# Patient Record
Sex: Male | Born: 1975 | Race: Black or African American | Hispanic: No | Marital: Single | Smoking: Never smoker
Health system: Southern US, Community
[De-identification: ages and names within clinical notes are randomized; demographics above are authoritative.]

## PROBLEM LIST (undated history)

## (undated) DIAGNOSIS — J189 Pneumonia, unspecified organism: Secondary | ICD-10-CM

## (undated) DIAGNOSIS — K219 Gastro-esophageal reflux disease without esophagitis: Secondary | ICD-10-CM

## (undated) HISTORY — PX: KNEE SURGERY: SHX244

## (undated) HISTORY — PX: BACK SURGERY: SHX140

---

## 2000-02-26 ENCOUNTER — Encounter: Payer: Self-pay | Admitting: Emergency Medicine

## 2000-02-26 ENCOUNTER — Emergency Department (HOSPITAL_COMMUNITY): Admission: EM | Admit: 2000-02-26 | Discharge: 2000-02-26 | Payer: Self-pay | Admitting: Emergency Medicine

## 2003-02-07 ENCOUNTER — Emergency Department (HOSPITAL_COMMUNITY): Admission: EM | Admit: 2003-02-07 | Discharge: 2003-02-07 | Payer: Self-pay | Admitting: Emergency Medicine

## 2003-02-25 ENCOUNTER — Emergency Department (HOSPITAL_COMMUNITY): Admission: EM | Admit: 2003-02-25 | Discharge: 2003-02-25 | Payer: Self-pay | Admitting: Emergency Medicine

## 2003-03-19 ENCOUNTER — Emergency Department (HOSPITAL_COMMUNITY): Admission: EM | Admit: 2003-03-19 | Discharge: 2003-03-19 | Payer: Self-pay | Admitting: Emergency Medicine

## 2009-10-30 ENCOUNTER — Emergency Department (HOSPITAL_COMMUNITY): Admission: EM | Admit: 2009-10-30 | Discharge: 2009-10-30 | Payer: Self-pay | Admitting: Emergency Medicine

## 2009-10-31 ENCOUNTER — Encounter: Admission: RE | Admit: 2009-10-31 | Discharge: 2009-10-31 | Payer: Self-pay | Admitting: Family Medicine

## 2009-11-25 ENCOUNTER — Inpatient Hospital Stay (HOSPITAL_COMMUNITY): Admission: RE | Admit: 2009-11-25 | Discharge: 2009-11-26 | Payer: Self-pay | Admitting: Orthopedic Surgery

## 2009-11-25 ENCOUNTER — Encounter (INDEPENDENT_AMBULATORY_CARE_PROVIDER_SITE_OTHER): Payer: Self-pay | Admitting: Orthopedic Surgery

## 2010-01-22 ENCOUNTER — Encounter: Admission: RE | Admit: 2010-01-22 | Discharge: 2010-01-22 | Payer: Self-pay | Admitting: Orthopedic Surgery

## 2010-07-10 LAB — CBC
MCH: 29.6 pg (ref 26.0–34.0)
MCHC: 33.4 g/dL (ref 30.0–36.0)
Platelets: 205 10*3/uL (ref 150–400)
RBC: 4.05 MIL/uL — ABNORMAL LOW (ref 4.22–5.81)
RDW: 12.8 % (ref 11.5–15.5)

## 2010-07-10 LAB — BASIC METABOLIC PANEL
BUN: 14 mg/dL (ref 6–23)
CO2: 30 mEq/L (ref 19–32)
Calcium: 8.5 mg/dL (ref 8.4–10.5)
Chloride: 107 mEq/L (ref 96–112)
Creatinine, Ser: 1.47 mg/dL (ref 0.4–1.5)
GFR calc Af Amer: >60 mL/min (ref >60)
GFR calc non Af Amer: 55 mL/min — ABNORMAL LOW (ref >60)
Glucose, Bld: 105 mg/dL — ABNORMAL HIGH (ref 70–99)
Potassium: 4.2 mEq/L (ref 3.5–5.1)
Sodium: 140 mEq/L (ref 135–145)

## 2010-07-11 LAB — DIFFERENTIAL
Basophils Absolute: 0.1 10*3/uL (ref 0.0–0.1)
Basophils Relative: 1 % (ref 0–1)
Lymphocytes Relative: 24 % (ref 12–46)
Monocytes Absolute: 0.5 10*3/uL (ref 0.1–1.0)
Monocytes Relative: 8 % (ref 3–12)
Neutro Abs: 4.2 10*3/uL (ref 1.7–7.7)
Neutrophils Relative %: 65 % (ref 43–77)

## 2010-07-11 LAB — URINALYSIS, ROUTINE W REFLEX MICROSCOPIC
Bilirubin Urine: NEGATIVE
Ketones, ur: NEGATIVE mg/dL
Nitrite: NEGATIVE
Protein, ur: NEGATIVE mg/dL
Urobilinogen, UA: 1 mg/dL (ref 0.0–1.0)

## 2010-07-11 LAB — COMPREHENSIVE METABOLIC PANEL
Albumin: 4.3 g/dL (ref 3.5–5.2)
Alkaline Phosphatase: 77 U/L (ref 39–117)
BUN: 12 mg/dL (ref 6–23)
Creatinine, Ser: 1.16 mg/dL (ref 0.4–1.5)
Glucose, Bld: 102 mg/dL — ABNORMAL HIGH (ref 70–99)
Total Bilirubin: 1.2 mg/dL (ref 0.3–1.2)
Total Protein: 7 g/dL (ref 6.0–8.3)

## 2010-07-11 LAB — ABO/RH: ABO/RH(D): O POS

## 2010-07-11 LAB — TYPE AND SCREEN: ABO/RH(D): O POS

## 2010-07-11 LAB — APTT: aPTT: 34 seconds (ref 24–37)

## 2010-07-11 LAB — CBC
HCT: 43.7 % (ref 39.0–52.0)
MCH: 30.5 pg (ref 26.0–34.0)
MCV: 90.8 fL (ref 78.0–100.0)
Platelets: 203 10*3/uL (ref 150–400)
RDW: 13.6 % (ref 11.5–15.5)

## 2010-07-11 LAB — PROTIME-INR: INR: 0.98 (ref 0.00–1.49)

## 2010-11-03 ENCOUNTER — Emergency Department (HOSPITAL_COMMUNITY)
Admission: EM | Admit: 2010-11-03 | Discharge: 2010-11-04 | Disposition: A | Discharge status: Home / Self Care | Payer: Self-pay | Attending: Emergency Medicine | Admitting: Emergency Medicine

## 2010-11-03 DIAGNOSIS — R51 Headache: Secondary | ICD-10-CM | POA: Insufficient documentation

## 2010-11-03 DIAGNOSIS — M542 Cervicalgia: Secondary | ICD-10-CM | POA: Insufficient documentation

## 2010-11-03 DIAGNOSIS — R509 Fever, unspecified: Secondary | ICD-10-CM | POA: Insufficient documentation

## 2010-11-04 ENCOUNTER — Encounter (HOSPITAL_COMMUNITY): Payer: Self-pay

## 2010-11-04 ENCOUNTER — Emergency Department (HOSPITAL_COMMUNITY): Payer: Self-pay

## 2010-11-04 LAB — DIFFERENTIAL
Eosinophils Absolute: 0 10*3/uL (ref 0.0–0.7)
Eosinophils Relative: 0 % (ref 0–5)
Lymphocytes Relative: 11 % — ABNORMAL LOW (ref 12–46)
Lymphs Abs: 0.8 10*3/uL (ref 0.7–4.0)
Monocytes Absolute: 0.5 10*3/uL (ref 0.1–1.0)
Monocytes Relative: 6 % (ref 3–12)

## 2010-11-04 LAB — BASIC METABOLIC PANEL
BUN: 11 mg/dL (ref 6–23)
CO2: 31 mEq/L (ref 19–32)
Calcium: 9.7 mg/dL (ref 8.4–10.5)
Chloride: 103 mEq/L (ref 96–112)
Creatinine, Ser: 1.19 mg/dL (ref 0.50–1.35)
Glucose, Bld: 118 mg/dL — ABNORMAL HIGH (ref 70–99)

## 2010-11-04 LAB — CBC
HCT: 44.4 % (ref 39.0–52.0)
MCH: 29.6 pg (ref 26.0–34.0)
MCHC: 33.6 g/dL (ref 30.0–36.0)
MCV: 88.3 fL (ref 78.0–100.0)
Platelets: 249 10*3/uL (ref 150–400)
RDW: 13.3 % (ref 11.5–15.5)

## 2010-11-04 LAB — APTT: aPTT: 36 seconds (ref 24–37)

## 2011-06-27 ENCOUNTER — Emergency Department (INDEPENDENT_AMBULATORY_CARE_PROVIDER_SITE_OTHER): Payer: Self-pay

## 2011-06-27 ENCOUNTER — Encounter (HOSPITAL_BASED_OUTPATIENT_CLINIC_OR_DEPARTMENT_OTHER): Payer: Self-pay | Admitting: *Deleted

## 2011-06-27 ENCOUNTER — Emergency Department (HOSPITAL_BASED_OUTPATIENT_CLINIC_OR_DEPARTMENT_OTHER)
Admission: EM | Admit: 2011-06-27 | Discharge: 2011-06-27 | Disposition: A | Discharge status: Home / Self Care | Payer: Self-pay | Attending: Emergency Medicine | Admitting: Emergency Medicine

## 2011-06-27 DIAGNOSIS — M545 Low back pain, unspecified: Secondary | ICD-10-CM | POA: Insufficient documentation

## 2011-06-27 DIAGNOSIS — M5137 Other intervertebral disc degeneration, lumbosacral region: Secondary | ICD-10-CM

## 2011-06-27 DIAGNOSIS — M51379 Other intervertebral disc degeneration, lumbosacral region without mention of lumbar back pain or lower extremity pain: Secondary | ICD-10-CM | POA: Insufficient documentation

## 2011-06-27 DIAGNOSIS — M79609 Pain in unspecified limb: Secondary | ICD-10-CM | POA: Insufficient documentation

## 2011-06-27 DIAGNOSIS — J45909 Unspecified asthma, uncomplicated: Secondary | ICD-10-CM | POA: Insufficient documentation

## 2011-06-27 DIAGNOSIS — IMO0002 Reserved for concepts with insufficient information to code with codable children: Secondary | ICD-10-CM

## 2011-06-27 MED ORDER — DIAZEPAM 5 MG PO TABS: 5.0000 mg | ORAL_TABLET | Freq: Two times a day (BID) | ORAL | Status: AC

## 2011-06-27 MED ORDER — OXYCODONE-ACETAMINOPHEN 5-325 MG PO TABS: 2.0000 | ORAL_TABLET | ORAL | Status: AC | PRN

## 2011-06-27 MED ORDER — KETOROLAC TROMETHAMINE 30 MG/ML IJ SOLN
60.0000 mg | Freq: Once | INTRAMUSCULAR | Status: AC
  Administered 2011-06-27: 60 mg via INTRAMUSCULAR
  Filled 2011-06-27: qty 2

## 2011-06-27 MED ORDER — MORPHINE SULFATE 4 MG/ML IJ SOLN
4.0000 mg | Freq: Once | INTRAMUSCULAR | Status: DC
  Filled 2011-06-27: qty 1

## 2011-06-27 MED ORDER — IBUPROFEN 800 MG PO TABS: 800.0000 mg | ORAL_TABLET | Freq: Three times a day (TID) | ORAL | Status: AC

## 2011-06-27 MED ORDER — DIAZEPAM 5 MG/ML IJ SOLN
5.0000 mg | Freq: Once | INTRAMUSCULAR | Status: AC
  Administered 2011-06-27: 5 mg via INTRAMUSCULAR
  Filled 2011-06-27: qty 2

## 2011-06-27 MED ORDER — OXYCODONE-ACETAMINOPHEN 5-325 MG PO TABS
1.0000 | ORAL_TABLET | Freq: Once | ORAL | Status: AC
  Administered 2011-06-27: 1 via ORAL
  Filled 2011-06-27: qty 1

## 2011-06-27 NOTE — Discharge Instructions (Signed)
Degenerative Disc Disease Degenerative disc disease is a condition caused by the changes that occur in the cushions of the backbone (spinal discs) as you grow older. Spinal discs are soft and compressible discs located between the bones of the spine (vertebrae). They act like shock absorbers. Degenerative disc disease can affect the wholespine. However, the neck and lower back are most commonly affected. Many changes can occur in the spinal discs with aging, such as:  The spinal discs may dry and shrink.   Small tears may occur in the tough, outer covering of the disc (annulus).   The disc space may become smaller due to loss of water.   Abnormal growths in the bone (spurs) may occur. This can put pressure on the nerve roots exiting the spinal canal, causing pain.   The spinal canal may become narrowed.  CAUSES  Degenerative disc disease is a condition caused by the changes that occur in the spinal discs with aging. The exact cause is not known, but there is a genetic basis for many patients. Degenerative changes can occur due to loss of fluid in the disc. This makes the disc thinner and reduces the space between the backbones. Small cracks can develop in the outer layer of the disc. This can lead to the breakdown of the disc. You are more likely to get degenerative disc disease if you are overweight. Smoking cigarettes and doing heavy work such as weightlifting can also increase your risk of this condition. Degenerative changes can start after a sudden injury. Growth of bone spurs can compress the nerve roots and cause pain.  SYMPTOMS  The symptoms vary from person to person. Some people may have no pain, while others have severe pain. The pain may be so severe that it can limit your activities. The location of the pain depends on the part of your backbone that is affected. You will have neck or arm pain if a disc in the neck area is affected. You will have pain in your back, buttocks, or legs if a  disc in the lower back is affected. The pain becomes worse while bending, reaching up, or with twisting movements. The pain may start gradually and then get worse as time passes. It may also start after a major or minor injury. You may feel numbness or tingling in the arms or legs.  DIAGNOSIS  Your caregiver will ask you about your symptoms and about activities or habits that may cause the pain. He or she may also ask about any injuries, diseases, ortreatments you have had earlier. Your caregiver will examine you to check for the range of movement that is possible in the affected area, to check for strength in your extremities, and to check for sensation in the areas of the arms and legs supplied by different nerve roots. An X-ray of the spine may be taken. Your caregiver may suggest other imaging tests, such as a computerized magnetic scan (MRI), if needed.  TREATMENT  Treatment includes rest, modifying your activities, and applying ice and heat. Your caregiver may prescribe medicines to reduce your pain and may ask you to do some exercises to strengthen your back. In some cases, you may need surgery. You and your caregiver will decide on the treatment that is best for you. HOME CARE INSTRUCTIONS   Follow proper lifting and walking techniques as advised by your caregiver.   Maintain good posture.   Exercise regularly as advised.   Perform relaxation exercises.   Change your sitting,   standing, and sleeping habits as advised. Change positions frequently.   Lose weight as advised.   Stop smoking if you smoke.   Wear supportive footwear.  SEEK MEDICAL CARE IF:  The pain does not go away within 1 to 4 weeks. SEEK IMMEDIATE MEDICAL CARE IF:   The pain is severe.   You notice weakness in your arms, hands, or legs.   You begin to lose control of your bladder or bowel.  MAKE SURE YOU:   Understand these instructions.   Will watch your condition.   Will get help right away if you are not  doing well or get worse.  Document Released: 02/07/2007 Document Revised: 04/01/2011 Document Reviewed: 02/07/2007 [employer] Patient Information 2012 [employer]. 

## 2011-06-27 NOTE — ED Provider Notes (Signed)
History     CSN: [id number]  Arrival date & time 06/27/11  1912   First MD Initiated Contact with Patient 06/27/11 1930      Chief Complaint  Patient presents with  . Back Pain    (Consider location/radiation/quality/duration/timing/severity/associated sxs/prior treatment) HPI Comments: Pt denies any recent injury:pt states that he has a history of 3 back surgeries in the past:pt states that he started a new workout this week but his symptoms started a couple days after the last workout:pt states that he hasn't been under any treatment since his last surgery  Patient is a 36 y.o. male presenting with back pain. The history is provided by the patient. No language interpreter was used.  Back Pain  This is a recurrent problem. The current episode started 6 to 12 hours ago. The problem occurs constantly. The problem has not changed since onset.The pain is associated with no known injury. The pain is present in the lumbar spine. The quality of the pain is described as shooting. The pain radiates to the right thigh. The pain is moderate. The symptoms are aggravated by bending, twisting and certain positions. The pain is the same all the time. Pertinent negatives include no fever, no bowel incontinence, no perianal numbness, no dysuria and no weakness. He has tried nothing for the symptoms.    Past Medical History  Diagnosis Date  . Asthma     Past Surgical History  Procedure Date  . Back surgery     History reviewed. No pertinent family history.  History  Substance Use Topics  . Smoking status: Never Smoker   . Smokeless tobacco: Not on file  . Alcohol Use:       Review of Systems  Constitutional: Negative for fever.  Gastrointestinal: Negative for bowel incontinence.  Genitourinary: Negative for dysuria.  Musculoskeletal: Positive for back pain.  Neurological: Negative for weakness.  All other systems reviewed and are negative.    Allergies  Shellfish allergy and  Morphine and related  Home Medications   Current Outpatient Rx  Name Route Sig Dispense Refill  . IBUPROFEN 200 MG PO TABS Oral Take 600 mg by mouth every 6 (six) hours as needed. For pain      BP 105/66  Pulse 74  Temp(Src) 97.2 F (36.2 C) (Oral)  Resp 17  Ht 5\' 8"  (1.727 m)  Wt 178 lb (80.74 kg)  BMI 27.06 kg/m2  SpO2 99%  Physical Exam  Nursing note and vitals reviewed. Constitutional: He is oriented to person, place, and time. He appears well-developed and well-nourished.  HENT:  Head: Normocephalic and atraumatic.  Cardiovascular: Normal rate and regular rhythm.   Pulmonary/Chest: Effort normal and breath sounds normal.  Musculoskeletal: Normal range of motion.       Lumbar back: He exhibits tenderness and pain. He exhibits normal range of motion.  Neurological: He is alert and oriented to person, place, and time. He exhibits normal muscle tone. Coordination normal.  Skin: Skin is warm and dry.  Psychiatric: He has a normal mood and affect.    ED Course  Procedures (including critical care time)  Labs Reviewed - No data to display Dg Lumbar Spine Complete  06/27/2011  *RADIOLOGY REPORT*  Clinical Data: Low back pain, prior back surgeries  LUMBAR SPINE - COMPLETE 4+ VIEW  Comparison: 11/25/2009 Correlation:  MRI lumbar spine 01/22/2010  Findings: Five non-rib bearing lumbar vertebrae. Osseous mineralization normal. Disc space narrowing L4-L5. Vertebral body and disc space heights otherwise maintained. No acute  fracture, subluxation or bone destruction. No spondylolysis. SI joints symmetric.  IMPRESSION: Mild degenerative disc disease changes L4-L5. No acute bony abnormalities.  Original Report Authenticated By: Lollie Marrow, M.D.     1. DDD (degenerative disc disease)       MDM  Pt is not having any neuro deficit:pt is able to ambulate without any problems        Teressa Lower, NP 06/27/11 2200

## 2011-06-27 NOTE — ED Notes (Signed)
Back pain starting tonight, states that he is unable to sit up and the pain is only relieved when his right leg is bent. Pain is in lower back and radiates down the right leg. States he has had back surgery, last sx in 2011

## 2011-06-30 NOTE — ED Provider Notes (Signed)
Evaluation and management procedures were performed by the PA/NP under my supervision/collaboration.   Felisa Bonier, MD 06/30/11 2035

## 2011-10-12 ENCOUNTER — Encounter (HOSPITAL_BASED_OUTPATIENT_CLINIC_OR_DEPARTMENT_OTHER): Payer: Self-pay | Admitting: Emergency Medicine

## 2011-10-12 ENCOUNTER — Emergency Department (HOSPITAL_BASED_OUTPATIENT_CLINIC_OR_DEPARTMENT_OTHER)
Admission: EM | Admit: 2011-10-12 | Discharge: 2011-10-12 | Disposition: A | Discharge status: Home / Self Care | Payer: Self-pay | Attending: Emergency Medicine | Admitting: Emergency Medicine

## 2011-10-12 DIAGNOSIS — M62838 Other muscle spasm: Secondary | ICD-10-CM | POA: Insufficient documentation

## 2011-10-12 LAB — URINALYSIS, ROUTINE W REFLEX MICROSCOPIC
Glucose, UA: NEGATIVE mg/dL
Hgb urine dipstick: NEGATIVE
Ketones, ur: NEGATIVE mg/dL
Leukocytes, UA: NEGATIVE
Protein, ur: NEGATIVE mg/dL
pH: 6 (ref 5.0–8.0)

## 2011-10-12 LAB — RAPID URINE DRUG SCREEN, HOSP PERFORMED
Amphetamines: NOT DETECTED
Benzodiazepines: NOT DETECTED
Tetrahydrocannabinol: NOT DETECTED

## 2011-10-12 MED ORDER — CYCLOBENZAPRINE HCL 10 MG PO TABS
5.0000 mg | ORAL_TABLET | Freq: Once | ORAL | Status: AC
  Administered 2011-10-12: 10 mg via ORAL
  Filled 2011-10-12: qty 1

## 2011-10-12 MED ORDER — KETOROLAC TROMETHAMINE 30 MG/ML IJ SOLN
30.0000 mg | Freq: Once | INTRAMUSCULAR | Status: AC
  Administered 2011-10-12: 30 mg via INTRAVENOUS
  Filled 2011-10-12: qty 1

## 2011-10-12 MED ORDER — CYCLOBENZAPRINE HCL 5 MG PO TABS: 5.0000 mg | ORAL_TABLET | Freq: Three times a day (TID) | ORAL | Status: AC | PRN

## 2011-10-12 NOTE — ED Provider Notes (Signed)
History     CSN: [id number]  Arrival date & time 10/12/11  1100   First MD Initiated Contact with Patient 10/12/11 1155      Chief Complaint  Patient presents with  . Back Pain    (Consider location/radiation/quality/duration/timing/severity/associated sxs/prior treatment) HPI Comments: 36 yo male with history of DDD presents with sharp right buttock and thigh pains. States he and his wife were having relations last evening and felt a sharp twinge. Then shortly after began having cramps in the leg radiating to posterior and lateral thigh. Severe, crampy, worse with position changing. Improved with flexion of hip.  Denies fever, back pain, abdominal pain, dysuria, hematuria, weakness, numbness, incontinence of bowel, bladder.   Patient is a 36 y.o. male presenting with back pain.  Back Pain     Past Medical History  Diagnosis Date  . Asthma     Past Surgical History  Procedure Date  . Back surgery     No family history on file.  History  Substance Use Topics  . Smoking status: Never Smoker   . Smokeless tobacco: Not on file  . Alcohol Use:       Review of Systems  Musculoskeletal: Positive for back pain.  All other systems reviewed and are negative.    Allergies  Shellfish allergy and Morphine and related  Home Medications   Current Outpatient Rx  Name Route Sig Dispense Refill  . IBUPROFEN 200 MG PO TABS Oral Take 600 mg by mouth every 6 (six) hours as needed. For pain      BP 105/51  Pulse 72  Temp 98.1 F (36.7 C)  Resp 22  SpO2 99%  Physical Exam  Vitals reviewed. Constitutional: He is oriented to person, place, and time. He appears well-developed and well-nourished.       Rocking in bed, hugging knees  HENT:  Head: Normocephalic and atraumatic.  Eyes: EOM are normal.  Neck: Normal range of motion. Neck supple.  Pulmonary/Chest: Effort normal.  Abdominal: Soft.  Musculoskeletal: He exhibits tenderness. He exhibits no edema.       No  back tenderness. Mild TTP in right SI joint area, hypertonic area in posterior thigh-no trigger points identified.  Normal hip ROM, negative FABER test. Negative SLT.  Neurological: He is alert and oriented to person, place, and time.       5/5 bilateral distal LE strength. Normal gait. Sensation intact.  Skin: No rash noted.  Psychiatric: He has a normal mood and affect.    ED Course  Procedures (including critical care time)  Labs Reviewed  URINALYSIS, ROUTINE W REFLEX MICROSCOPIC - Abnormal; Notable for the following:    Specific Gravity, Urine 1.036 (*)     All other components within normal limits  URINE RAPID DRUG SCREEN (HOSP PERFORMED) - Abnormal; Notable for the following:    Barbiturates POSITIVE (*)     All other components within normal limits   No results found.   1. Muscle spasm of right leg       MDM  36 yo male with history of DDD and right buttock/thigh cramping after an injury/strain during sex. Patient quite uncomfortable. Most likely musculature strain, UA negative for urinary etiology, no sign of back injury or red flags. Toradol x 1.   1:53 PM Pain improved, will DC with supportive care for likely strain of hamstring with some residual cramping. Flexeril for cramp and prn NSAID, tylenol. Recommend stretch, heat, massage. Follow up if not improved or worsening. Noted  positive barb on UDS, but motrin causes false positives.        Durwin Reges, MD 10/12/11 1356

## 2011-10-12 NOTE — ED Provider Notes (Signed)
I saw and evaluated the patient, reviewed the resident's note and I agree with the findings and plan.   .Face to face Exam:  General:  Awake HEENT:  Atraumatic Resp:  Normal effort Abd:  Nondistended Neuro:No focal weakness Lymph: No adenopathy   Yolander Goodie L Victoria Henshaw, MD 10/12/11 2259 

## 2011-10-12 NOTE — ED Notes (Signed)
C/o of back type spasm pain since this am. States located to rt buttock side that radiates to rt upper thigh

## 2011-10-12 NOTE — Discharge Instructions (Signed)
You have a severe thigh spasm related to injury.  You may try muscle relaxant in short term. Also helpful to try stretch and heat therapy and massage. Return to ED if you have weakness, numbness, back pain, fever, or other concerning symptoms.  Muscle Cramps Muscle cramps are due to sudden involuntary muscle contraction. This means you have no control over the tightening of a muscle (or muscles). Often there are no obvious causes. Muscle cramps may occur with overexertion. They may also occur with chilling of the muscles. An example of a muscle chilling activity is swimming. It is uncommon for cramps to be due to a serious underlying disorder. In most cases, muscle cramps improve (or leave) within minutes. CAUSES  Some common causes are:  Injury.   Infections, especially viral.   Abnormal levels of the salts and ions in your blood (electrolytes). This could happen if you are taking water pills (diuretics).   Blood vessel disease where not enough blood is getting to the muscles (intermittent claudication).  Some uncommon causes are:  Side effects of some medicine (such as lithium).   Alcohol abuse.   Diseases where there is soreness (inflammation) of the muscular system.  HOME CARE INSTRUCTIONS   It may be helpful to massage, stretch, and relax the affected muscle.   Taking a dose of over-the-counter diphenhydramine is helpful for night leg cramps.  SEEK MEDICAL CARE IF:  Cramps are frequent and not relieved with medicine. MAKE SURE YOU:   Understand these instructions.   Will watch your condition.   Will get help right away if you are not doing well or get worse.  Document Released: 10/02/2001 Document Revised: 04/01/2011 Document Reviewed: 04/03/2008 [facility] Patient Information 2012 [place], [place].

## 2011-10-14 ENCOUNTER — Emergency Department (HOSPITAL_BASED_OUTPATIENT_CLINIC_OR_DEPARTMENT_OTHER)
Admission: EM | Admit: 2011-10-14 | Discharge: 2011-10-14 | Disposition: A | Discharge status: Home / Self Care | Payer: Self-pay | Attending: Emergency Medicine | Admitting: Emergency Medicine

## 2011-10-14 ENCOUNTER — Encounter (HOSPITAL_BASED_OUTPATIENT_CLINIC_OR_DEPARTMENT_OTHER): Payer: Self-pay | Admitting: *Deleted

## 2011-10-14 DIAGNOSIS — M62838 Other muscle spasm: Secondary | ICD-10-CM | POA: Insufficient documentation

## 2011-10-14 MED ORDER — DIAZEPAM 5 MG PO TABS: ORAL_TABLET | ORAL | Status: DC

## 2011-10-14 MED ORDER — KETOROLAC TROMETHAMINE 60 MG/2ML IM SOLN
60.0000 mg | Freq: Once | INTRAMUSCULAR | Status: AC
  Administered 2011-10-14: 60 mg via INTRAMUSCULAR
  Filled 2011-10-14: qty 2

## 2011-10-14 MED ORDER — DIAZEPAM 5 MG/ML IJ SOLN
10.0000 mg | Freq: Once | INTRAMUSCULAR | Status: AC
  Administered 2011-10-14: 10 mg via INTRAMUSCULAR
  Filled 2011-10-14: qty 2

## 2011-10-14 NOTE — ED Notes (Signed)
Spasms in his right leg. Hx of back pain. States he was seen a few days ago for same. States he needs stronger pain medication. Hx of back surgery spoke to his orthopedic doctor that told him he will have muscle spasms.

## 2011-10-14 NOTE — Discharge Instructions (Signed)
Muscle Cramps Muscle cramps are when muscles tighten by themselves. Muscle cramps usually improve or go away within minutes. HOME CARE  Massage the muscle.   Stretch the muscle.   Relax the muscle.   Only take medicine as told by your doctor.   Drink enough fluids to keep your pee (urine) clear or pale yellow.  GET HELP RIGHT AWAY IF:  Cramps are frequent and do not get better with medicine. MAKE SURE YOU:  Understand these intructions.   Will watch your condition.   Will get help right away if your are not doing well or get worse.  Document Released: 03/25/2008 Document Revised: 04/01/2011 Document Reviewed: 04/03/2008 [facility] Patient Information 2012 [place], [place].

## 2011-10-14 NOTE — ED Provider Notes (Signed)
History     CSN: [id number]  Arrival date & time 10/14/11  1346   First MD Initiated Contact with Patient 10/14/11 1439      Chief Complaint  Patient presents with  . Leg Pain    (Consider location/radiation/quality/duration/timing/severity/associated sxs/prior treatment) Patient is a 36 y.o. male presenting with leg pain. The history is provided by the patient. No language interpreter was used.  Leg Pain  The incident occurred 2 days ago. The incident occurred at home. There was no injury mechanism. The pain is present in the right thigh. The quality of the pain is described as throbbing. The pain is at a severity of 7/10. The pain is moderate. The pain has been worsening since onset. Associated symptoms include numbness. He has tried nothing for the symptoms.  Pt was seen here 2 days ago and was given torodol.   Pt reports he still has the same pain.   Pt reports he was seen here 3 months ago and had a shot that made the spasm stop.   Pt reports flexeril has not worked.   I reviewed records.  Pt received valium and torodol in March  Past Medical History  Diagnosis Date  . Asthma     Past Surgical History  Procedure Date  . Back surgery     No family history on file.  History  Substance Use Topics  . Smoking status: Never Smoker   . Smokeless tobacco: Not on file  . Alcohol Use: No      Review of Systems  Neurological: Positive for numbness.  All other systems reviewed and are negative.    Allergies  Shellfish allergy and Morphine and related  Home Medications   Current Outpatient Rx  Name Route Sig Dispense Refill  . CYCLOBENZAPRINE HCL 5 MG PO TABS Oral Take 1 tablet (5 mg total) by mouth 3 (three) times daily as needed for muscle spasms. 30 tablet 0  . IBUPROFEN 200 MG PO TABS Oral Take 600 mg by mouth every 6 (six) hours as needed. For pain      BP 149/79  Pulse 110  Temp 98 F (36.7 C) (Oral)  Resp 20  SpO2 100%  Physical Exam  Nursing note and  vitals reviewed. Constitutional: He appears well-developed and well-nourished.  HENT:  Head: Normocephalic.  Eyes: Pupils are equal, round, and reactive to light.  Neck: Normal range of motion.  Cardiovascular: Normal rate and normal heart sounds.   Pulmonary/Chest: Effort normal.  Abdominal: Soft. Bowel sounds are normal.  Musculoskeletal: He exhibits tenderness.       Tender right thigh.  nontender ls spine,  Surgical incision healed  Neurological: He is alert.    ED Course  Procedures (including critical care time)  Labs Reviewed - No data to display No results found.   1. Muscle spasm       MDM  Pt advised to return if any problems.        Lonia Skinner [place], [place] 10/14/11 1529  Lonia Skinner [place], [place] 10/14/11 1530

## 2011-10-14 NOTE — ED Notes (Signed)
Drove himself here

## 2011-10-14 NOTE — ED Notes (Signed)
Pt. Wife to come and get the Pt.

## 2011-10-14 NOTE — ED Notes (Signed)
Pt. [facility]

## 2011-10-15 NOTE — ED Provider Notes (Signed)
Medical screening examination/treatment/procedure(s) were performed by non-physician practitioner and as supervising physician I was immediately available for consultation/collaboration.  Geoffery Lyons, MD 10/15/11 1118

## 2013-02-12 ENCOUNTER — Encounter (HOSPITAL_BASED_OUTPATIENT_CLINIC_OR_DEPARTMENT_OTHER): Payer: Self-pay | Admitting: Emergency Medicine

## 2013-02-12 ENCOUNTER — Emergency Department (HOSPITAL_BASED_OUTPATIENT_CLINIC_OR_DEPARTMENT_OTHER)
Admission: EM | Admit: 2013-02-12 | Discharge: 2013-02-12 | Disposition: A | Discharge status: Home / Self Care | Payer: BC Managed Care – PPO | Attending: Emergency Medicine | Admitting: Emergency Medicine

## 2013-02-12 DIAGNOSIS — Z79899 Other long term (current) drug therapy: Secondary | ICD-10-CM | POA: Insufficient documentation

## 2013-02-12 DIAGNOSIS — J45909 Unspecified asthma, uncomplicated: Secondary | ICD-10-CM | POA: Insufficient documentation

## 2013-02-12 DIAGNOSIS — K120 Recurrent oral aphthae: Secondary | ICD-10-CM | POA: Insufficient documentation

## 2013-02-12 MED ORDER — FIRST-DUKES MOUTHWASH MT SUSP: 5.0000 mL | Freq: Three times a day (TID) | OROMUCOSAL | Status: DC

## 2013-02-12 NOTE — ED Provider Notes (Signed)
CSN: [id number]     Arrival date & time 02/12/13  1806 History   First MD Initiated Contact with Patient 02/12/13 1821     Chief Complaint  Patient presents with  . Foreign Body   (Consider location/radiation/quality/duration/timing/severity/associated sxs/prior Treatment) Patient is a 37 y.o. male presenting with foreign body. The history is provided by the patient and medical records. No language interpreter was used.  Foreign Body Associated symptoms: no abdominal pain, no congestion, no cough, no drooling, no ear pain, no nausea, no rhinorrhea, no sore throat, no trouble swallowing, no voice change and no vomiting     Jacob Church is a 37 y.o. male  with a hx of asthma presents to the Emergency Department complaining of gradual, persistent, progressively worsening oral pain under the left side of his tongue onset personally one week ago, 3 days after feeling as if a piece of pepper became lodged in his sublingual space. Patient reports wishing multiple times per day and continuing to feel the sensation of a foreign body. He states eating, talking and movement of his tongue make the pain worse. He reports nothing makes it better. He reports he has made an appointment with his dentist the pain has been getting progressively worse.  Pt denies fever, chills, headache, neck pain, chest pain, shortness of breath, oral swelling, difficulty swallowing, nausea, vomiting, diarrhea.     Past Medical History  Diagnosis Date  . Asthma    Past Surgical History  Procedure Laterality Date  . Back surgery     No family history on file. History  Substance Use Topics  . Smoking status: Never Smoker   . Smokeless tobacco: Not on file  . Alcohol Use: No    Review of Systems  Constitutional: Negative for fever, chills and fatigue.  HENT: Positive for mouth sores. Negative for congestion, dental problem, drooling, ear pain, facial swelling, postnasal drip, rhinorrhea, sore throat, tinnitus,  trouble swallowing and voice change.   Eyes: Negative for pain.  Respiratory: Negative for cough, chest tightness and shortness of breath.   Cardiovascular: Negative for chest pain.  Gastrointestinal: Negative for nausea, vomiting and abdominal pain.  Musculoskeletal: Negative for neck pain and neck stiffness.  Skin: Negative for rash.  Neurological: Negative for facial asymmetry and headaches.  Hematological: Negative for adenopathy.  Psychiatric/Behavioral: The patient is not nervous/anxious.     Allergies  Shellfish allergy and Morphine and related  Home Medications   Current Outpatient Rx  Name  Route  Sig  Dispense  Refill  . albuterol (PROVENTIL HFA;VENTOLIN HFA) 108 (90 BASE) MCG/ACT inhaler   Inhalation   Inhale 2 puffs into the lungs every 6 (six) hours as needed. For shortness of breath         . diazepam (VALIUM) 5 MG tablet      One tablet po q 6 prn muscle spasm.   12 tablet   0   . Diphenhyd-Hydrocort-Nystatin (FIRST-DUKES MOUTHWASH) SUSP   Mouth/Throat   Use as directed 5 mLs in the mouth or throat 3 (three) times daily.   150 mL   0   . ibuprofen (ADVIL,MOTRIN) 200 MG tablet   Oral   Take 600 mg by mouth every 6 (six) hours as needed. For pain         . Multiple Vitamin (MULTIVITAMIN WITH MINERALS) TABS   Oral   Take 1 tablet by mouth daily.          BP 117/62  Pulse 59  Temp(Src)  97.8 F (36.6 C) (Oral)  Resp 16  Ht 5\' 7"  (1.702 m)  Wt 186 lb (84.369 kg)  BMI 29.12 kg/m2  SpO2 99% Physical Exam  Nursing note and vitals reviewed. Constitutional: He appears well-developed and well-nourished. No distress.  Awake, alert, nontoxic appearance  HENT:  Head: Normocephalic and atraumatic.  Right Ear: Hearing, tympanic membrane, external ear and ear canal normal.  Left Ear: Hearing, tympanic membrane, external ear and ear canal normal.  Nose: No mucosal edema.  Mouth/Throat: Uvula is midline, oropharynx is clear and moist and mucous membranes  are normal. Mucous membranes are not dry. Oral lesions present. Normal dentition. No dental abscesses, uvula swelling or dental caries. No oropharyngeal exudate, posterior oropharyngeal edema, posterior oropharyngeal erythema or tonsillar abscesses.    Sublingual aphthous ulcer under the left side of the tongue No foreign body visible or palpable Mild swelling and irritation of the frenulum  Eyes: Conjunctivae are normal. No scleral icterus.  Neck: Normal range of motion. Neck supple.  Cardiovascular: Normal rate, regular rhythm, normal heart sounds and intact distal pulses.   No murmur heard. Pulmonary/Chest: Effort normal and breath sounds normal. No respiratory distress. He has no wheezes.  Abdominal: He exhibits no distension. There is no tenderness. There is no rebound and no guarding.  Musculoskeletal: Normal range of motion. He exhibits no edema.  Lymphadenopathy:       Head (right side): No submental, no submandibular, no tonsillar, no preauricular, no posterior auricular and no occipital adenopathy present.       Head (left side): No submental, no submandibular, no tonsillar, no preauricular, no posterior auricular and no occipital adenopathy present.    He has no cervical adenopathy.       Right cervical: No superficial cervical, no deep cervical and no posterior cervical adenopathy present.      Left cervical: No superficial cervical, no deep cervical and no posterior cervical adenopathy present.  No lymphadenopathy  Neurological: He is alert.  Speech is clear and goal oriented Moves extremities without ataxia  Skin: Skin is warm and dry. He is not diaphoretic. No erythema.  Psychiatric: He has a normal mood and affect. His behavior is normal.    ED Course  Procedures (including critical care time) Labs Review Labs Reviewed - No data to display Imaging Review No results found.  EKG Interpretation   None       MDM   1. Oral aphthous ulcer    Jacob Church  presents with oral pain and questionable foreign body.  Sublingual space visualized, palpated and flush with normal saline without evidence of foreign body. Visible aphthous ulcer of the left sublingual space. This is likely the source of the patient's pain and foreign body sensation.  Will treat with mouthwash. Encouraged warm saline mouthwash and followup with dentist as already scheduled appointment.  No induration of the floor of the mouth or soft tissues. No palpable or visual abscess.  It has been determined that no acute conditions requiring further emergency intervention are present at this time. The patient/guardian have been advised of the diagnosis and plan. We have discussed signs and symptoms that warrant return to the ED, such as changes or worsening in symptoms.   Vital signs are stable at discharge.   BP 117/62  Pulse 59  Temp(Src) 97.8 F (36.6 C) (Oral)  Resp 16  Ht 5\' 7"  (1.702 m)  Wt 186 lb (84.369 kg)  BMI 29.12 kg/m2  SpO2 99%  Patient/guardian has voiced understanding  and agreed to follow-up with the PCP or specialist.        Dierdre Forth, PA-C 02/12/13 1929

## 2013-02-12 NOTE — ED Notes (Signed)
PT sts he has a piece of peppercorn stuck under his tongue x several days now.

## 2013-02-13 NOTE — ED Provider Notes (Signed)
Medical screening examination/treatment/procedure(s) were performed by non-physician practitioner and as supervising physician I was immediately available for consultation/collaboration.   Junius Argyle, MD 02/13/13 1109

## 2014-02-12 ENCOUNTER — Emergency Department (HOSPITAL_BASED_OUTPATIENT_CLINIC_OR_DEPARTMENT_OTHER)
Admission: EM | Admit: 2014-02-12 | Discharge: 2014-02-12 | Disposition: A | Discharge status: Home / Self Care | Payer: No Typology Code available for payment source | Attending: Emergency Medicine | Admitting: Emergency Medicine

## 2014-02-12 ENCOUNTER — Emergency Department (HOSPITAL_BASED_OUTPATIENT_CLINIC_OR_DEPARTMENT_OTHER): Payer: No Typology Code available for payment source

## 2014-02-12 ENCOUNTER — Encounter (HOSPITAL_BASED_OUTPATIENT_CLINIC_OR_DEPARTMENT_OTHER): Payer: Self-pay | Admitting: Emergency Medicine

## 2014-02-12 DIAGNOSIS — J45909 Unspecified asthma, uncomplicated: Secondary | ICD-10-CM | POA: Diagnosis not present

## 2014-02-12 DIAGNOSIS — Z79899 Other long term (current) drug therapy: Secondary | ICD-10-CM | POA: Diagnosis not present

## 2014-02-12 DIAGNOSIS — M7581 Other shoulder lesions, right shoulder: Secondary | ICD-10-CM

## 2014-02-12 DIAGNOSIS — M75101 Unspecified rotator cuff tear or rupture of right shoulder, not specified as traumatic: Secondary | ICD-10-CM | POA: Diagnosis not present

## 2014-02-12 DIAGNOSIS — Z791 Long term (current) use of non-steroidal anti-inflammatories (NSAID): Secondary | ICD-10-CM | POA: Insufficient documentation

## 2014-02-12 DIAGNOSIS — M25511 Pain in right shoulder: Secondary | ICD-10-CM | POA: Diagnosis present

## 2014-02-12 MED ORDER — IBUPROFEN 800 MG PO TABS: 800.0000 mg | ORAL_TABLET | Freq: Three times a day (TID) | ORAL | Status: DC

## 2014-02-12 NOTE — ED Notes (Signed)
Pt c/o right shoulder pain x 2 days

## 2014-02-12 NOTE — ED Provider Notes (Addendum)
CSN: [id number]     Arrival date & time 02/12/14  1441 History   First MD Initiated Contact with Patient 02/12/14 1613     Chief Complaint  Patient presents with  . Shoulder Pain     (Consider location/radiation/quality/duration/timing/severity/associated sxs/prior Treatment) HPI Comments: R shoulder pain with workouts. Hx of "clicking" in the shoulder for long time, worsened over past month, now having sharp pains with working out. Able to still do pullups. No trauma.   Patient is a 38 y.o. male presenting with shoulder pain. The history is provided by the patient.  Shoulder Pain This is a new problem. The current episode started more than 1 week ago. The problem occurs constantly. The problem has not changed since onset.Pertinent negatives include no chest pain, no abdominal pain, no headaches and no shortness of breath. Nothing aggravates the symptoms. Nothing relieves the symptoms.    Past Medical History  Diagnosis Date  . Asthma    Past Surgical History  Procedure Laterality Date  . Back surgery     History reviewed. No pertinent family history. History  Substance Use Topics  . Smoking status: Never Smoker   . Smokeless tobacco: Not on file  . Alcohol Use: No    Review of Systems  Constitutional: Negative for fever and chills.  Respiratory: Negative for shortness of breath.   Cardiovascular: Negative for chest pain.  Gastrointestinal: Negative for abdominal pain.  Neurological: Negative for headaches.  All other systems reviewed and are negative.     Allergies  Shellfish allergy and Morphine and related  Home Medications   Prior to Admission medications   Medication Sig Start Date End Date Taking? Authorizing Provider  albuterol (PROVENTIL HFA;VENTOLIN HFA) 108 (90 BASE) MCG/ACT inhaler Inhale 2 puffs into the lungs every 6 (six) hours as needed. For shortness of breath    Historical Provider, MD  diazepam (VALIUM) 5 MG tablet One tablet po q 6 prn muscle  spasm. 10/14/11   Elson AreasLeslie K Sofia, PA-C  Diphenhyd-Hydrocort-Nystatin (FIRST-DUKES MOUTHWASH) SUSP Use as directed 5 mLs in the mouth or throat 3 (three) times daily. 02/12/13   Hannah Muthersbaugh, PA-C  ibuprofen (ADVIL,MOTRIN) 200 MG tablet Take 600 mg by mouth every 6 (six) hours as needed. For pain    Historical Provider, MD  ibuprofen (ADVIL,MOTRIN) 800 MG tablet Take 1 tablet (800 mg total) by mouth 3 (three) times daily. 02/12/14   Elwin MochaBlair Bresha Hosack, MD  Multiple Vitamin (MULTIVITAMIN WITH MINERALS) TABS Take 1 tablet by mouth daily.    Historical Provider, MD   BP 125/73  Pulse 77  Temp(Src) 97.8 F (36.6 C) (Oral)  Resp 16  Ht 5\' 8"  (1.727 m)  Wt 198 lb (89.812 kg)  BMI 30.11 kg/m2  SpO2 100% Physical Exam  Nursing note and vitals reviewed. Constitutional: He is oriented to person, place, and time. He appears well-developed and well-nourished. No distress.  HENT:  Head: Normocephalic and atraumatic.  Mouth/Throat: No oropharyngeal exudate.  Eyes: EOM are normal. Pupils are equal, round, and reactive to light.  Neck: Normal range of motion. Neck supple.  Cardiovascular: Normal rate and regular rhythm.  Exam reveals no friction rub.   No murmur heard. Pulmonary/Chest: Effort normal and breath sounds normal. No respiratory distress. He has no wheezes. He has no rales.  Abdominal: He exhibits no distension. There is no tenderness. There is no rebound.  Musculoskeletal: He exhibits no edema.       Right shoulder: He exhibits decreased range of motion (decreased int/ext  rotation against resistance due to pain) and pain (with int/ext rotation, late abduction). He exhibits no tenderness, no bony tenderness and no spasm.  Neurological: He is alert and oriented to person, place, and time.  Skin: He is not diaphoretic.    ED Course  Procedures (including critical care time) Labs Review Labs Reviewed - No data to display  Imaging Review Dg Shoulder Right  02/12/2014   CLINICAL DATA:   Right shoulder pain.  EXAM: RIGHT SHOULDER - 2+ VIEW  COMPARISON:  None.  FINDINGS: The joint spaces are maintained. No acute bony findings or degenerative changes. No abnormal soft tissue calcifications. The visualized right lung is clear.  IMPRESSION: No acute bony findings.   Electronically Signed   By: Loralie ChampagneMark  Gallerani M.D.   On: 02/12/2014 14:59     EKG Interpretation None      MDM   Final diagnoses:  Rotator cuff tendinitis, right    73M with R shoulder pain. No trauma. Pain with int/ext rotation, late abduction, c/w rotator cuff injury. WIthout trauma and with his ability to still do pull-ups, no need for xray, doubt bony injury. Will give NSAIDs, referral to sports med. Stable for discharge.    Elwin MochaBlair Kinnedy Mongiello, MD 02/12/14 1725  Elwin MochaBlair Lavonia Eager, MD 02/12/14 [phone]

## 2014-02-12 NOTE — Discharge Instructions (Signed)
Rotator Cuff Injury Rotator cuff injury is any type of injury to the set of muscles and tendons that make up the stabilizing unit of your shoulder. This unit holds the ball of your upper arm bone (humerus) in the socket of your shoulder blade (scapula).  CAUSES Injuries to your rotator cuff most commonly come from sports or activities that cause your arm to be moved repeatedly over your head. Examples of this include throwing, weight lifting, swimming, or racquet sports. Long lasting (chronic) irritation of your rotator cuff can cause soreness and swelling (inflammation), bursitis, and eventual damage to your tendons, such as a tear (rupture). SIGNS AND SYMPTOMS Acute rotator cuff tear:  Sudden tearing sensation followed by severe pain shooting from your upper shoulder down your arm toward your elbow.  Decreased range of motion of your shoulder because of pain and muscle spasm.  Severe pain.  Inability to raise your arm out to the side because of pain and loss of muscle power (large tears). Chronic rotator cuff tear:  Pain that usually is worse at night and may interfere with sleep.  Gradual weakness and decreased shoulder motion as the pain worsens.  Decreased range of motion. Rotator cuff tendinitis:  Deep ache in your shoulder and the outside upper arm over your shoulder.  Pain that comes on gradually and becomes worse when lifting your arm to the side or turning it inward. DIAGNOSIS Rotator cuff injury is diagnosed through a medical history, physical exam, and imaging exam. The medical history helps determine the type of rotator cuff injury. Your health care provider will look at your injured shoulder, feel the injured area, and ask you to move your shoulder in different positions. X-ray exams typically are done to rule out other causes of shoulder pain, such as fractures. MRI is the exam of choice for the most severe shoulder injuries because the images show muscles and tendons.    TREATMENT  Chronic tear:  Medicine for pain, such as acetaminophen or ibuprofen.  Physical therapy and range-of-motion exercises may be helpful in maintaining shoulder function and strength.  Steroid injections into your shoulder joint.  Surgical repair of the rotator cuff if the injury does not heal with noninvasive treatment. Acute tear:  Anti-inflammatory medicines such as ibuprofen and naproxen to help reduce pain and swelling.  A sling to help support your arm and rest your rotator cuff muscles. Long-term use of a sling is not advised. It may cause significant stiffening of the shoulder joint.  Surgery may be considered within a few weeks, especially in younger, active people, to return the shoulder to full function.  Indications for surgical treatment include the following:  Age younger than 60 years.  Rotator cuff tears that are complete.  Physical therapy, rest, and anti-inflammatory medicines have been used for 6-8 weeks, with no improvement.  Employment or sporting activity that requires constant shoulder use. Tendinitis:  Anti-inflammatory medicines such as ibuprofen and naproxen to help reduce pain and swelling.  A sling to help support your arm and rest your rotator cuff muscles. Long-term use of a sling is not advised. It may cause significant stiffening of the shoulder joint.  Severe tendinitis may require:  Steroid injections into your shoulder joint.  Physical therapy.  Surgery. HOME CARE INSTRUCTIONS   Apply ice to your injury:  Put ice in a plastic bag.  Place a towel between your skin and the bag.  Leave the ice on for 20 minutes, 2-3 times a day.  If you   have a shoulder immobilizer (sling and straps), wear it until told otherwise by your health care provider.  You may want to sleep on several pillows or in a recliner at night to lessen swelling and pain.  Only take over-the-counter or prescription medicines for pain, discomfort, or fever as  directed by your health care provider.  Do simple hand squeezing exercises with a soft rubber ball to decrease hand swelling. SEEK MEDICAL CARE IF:   Your shoulder pain increases, or new pain or numbness develops in your arm, hand, or fingers.  Your hand or fingers are colder than your other hand. SEEK IMMEDIATE MEDICAL CARE IF:   Your arm, hand, or fingers are numb or tingling.  Your arm, hand, or fingers are increasingly swollen and painful, or they turn white or blue. MAKE SURE YOU:  Understand these instructions.  Will watch your condition.  Will get help right away if you are not doing well or get worse. Document Released: 04/09/2000 Document Revised: 04/17/2013 Document Reviewed: 11/22/2012 [employer] Patient Information 2015 [employer]. This information is not intended to replace advice given to you by your health care provider. Make sure you discuss any questions you have with your health care provider.  

## 2014-07-10 ENCOUNTER — Emergency Department (HOSPITAL_BASED_OUTPATIENT_CLINIC_OR_DEPARTMENT_OTHER): Payer: PRIVATE HEALTH INSURANCE

## 2014-07-10 ENCOUNTER — Emergency Department (HOSPITAL_BASED_OUTPATIENT_CLINIC_OR_DEPARTMENT_OTHER)
Admission: EM | Admit: 2014-07-10 | Discharge: 2014-07-10 | Disposition: A | Discharge status: Home / Self Care | Payer: PRIVATE HEALTH INSURANCE | Attending: Emergency Medicine | Admitting: Emergency Medicine

## 2014-07-10 ENCOUNTER — Encounter (HOSPITAL_BASED_OUTPATIENT_CLINIC_OR_DEPARTMENT_OTHER): Payer: Self-pay | Admitting: *Deleted

## 2014-07-10 DIAGNOSIS — J45909 Unspecified asthma, uncomplicated: Secondary | ICD-10-CM | POA: Diagnosis not present

## 2014-07-10 DIAGNOSIS — R05 Cough: Secondary | ICD-10-CM

## 2014-07-10 DIAGNOSIS — Z79899 Other long term (current) drug therapy: Secondary | ICD-10-CM | POA: Insufficient documentation

## 2014-07-10 DIAGNOSIS — J029 Acute pharyngitis, unspecified: Secondary | ICD-10-CM | POA: Insufficient documentation

## 2014-07-10 DIAGNOSIS — R6889 Other general symptoms and signs: Secondary | ICD-10-CM

## 2014-07-10 DIAGNOSIS — R059 Cough, unspecified: Secondary | ICD-10-CM

## 2014-07-10 LAB — RAPID STREP SCREEN (MED CTR MEBANE ONLY): Streptococcus, Group A Screen (Direct): NEGATIVE

## 2014-07-10 MED ORDER — GUAIFENESIN-CODEINE 100-10 MG/5ML PO SOLN: 5.0000 mL | ORAL | Status: DC | PRN

## 2014-07-10 MED ORDER — OSELTAMIVIR PHOSPHATE 75 MG PO CAPS: 75.0000 mg | ORAL_CAPSULE | Freq: Two times a day (BID) | ORAL | Status: DC

## 2014-07-10 MED ORDER — ALBUTEROL SULFATE HFA 108 (90 BASE) MCG/ACT IN AERS: 2.0000 | INHALATION_SPRAY | RESPIRATORY_TRACT | Status: DC | PRN

## 2014-07-10 MED ORDER — ONDANSETRON 4 MG PO TBDP: 4.0000 mg | ORAL_TABLET | Freq: Three times a day (TID) | ORAL | Status: DC | PRN

## 2014-07-10 MED ORDER — IBUPROFEN 800 MG PO TABS
800.0000 mg | ORAL_TABLET | Freq: Once | ORAL | Status: AC
  Administered 2014-07-10: 800 mg via ORAL
  Filled 2014-07-10: qty 1

## 2014-07-10 MED ORDER — IBUPROFEN 800 MG PO TABS: 800.0000 mg | ORAL_TABLET | Freq: Three times a day (TID) | ORAL | Status: DC | PRN

## 2014-07-10 NOTE — Discharge Instructions (Signed)
You may alternate between Tylenol 1000 mg every 6 hours as needed for fever and pain and ibuprofen 800 mg every 8 hours as needed for fever and pain.   Influenza Influenza ("the flu") is a viral infection of the respiratory tract. It occurs more often in winter months because people spend more time in close contact with one another. Influenza can make you feel very sick. Influenza easily spreads from person to person (contagious). CAUSES  Influenza is caused by a virus that infects the respiratory tract. You can catch the virus by breathing in droplets from an infected person's cough or sneeze. You can also catch the virus by touching something that was recently contaminated with the virus and then touching your mouth, nose, or eyes. RISKS AND COMPLICATIONS You may be at risk for a more severe case of influenza if you smoke cigarettes, have diabetes, have chronic heart disease (such as heart failure) or lung disease (such as asthma), or if you have a weakened immune system. Elderly people and pregnant women are also at risk for more serious infections. The most common problem of influenza is a lung infection (pneumonia). Sometimes, this problem can require emergency medical care and may be life threatening. SIGNS AND SYMPTOMS  Symptoms typically last 4 to 10 days and may include:  Fever.  Chills.  Headache, body aches, and muscle aches.  Sore throat.  Chest discomfort and cough.  Poor appetite.  Weakness or feeling tired.  Dizziness.  Nausea or vomiting. DIAGNOSIS  Diagnosis of influenza is often made based on your history and a physical exam. A nose or throat swab test can be done to confirm the diagnosis. TREATMENT  In mild cases, influenza goes away on its own. Treatment is directed at relieving symptoms. For more severe cases, your health care provider may prescribe antiviral medicines to shorten the sickness. Antibiotic medicines are not effective because the infection is caused  by a virus, not by bacteria. HOME CARE INSTRUCTIONS  Take medicines only as directed by your health care provider.  Use a cool mist humidifier to make breathing easier.  Get plenty of rest until your temperature returns to normal. This usually takes 3 to 4 days.  Drink enough fluid to keep your urine clear or pale yellow.  Cover yourmouth and nosewhen coughing or sneezing,and wash your handswellto prevent thevirusfrom spreading.  Stay homefromwork orschool untilthe fever is gonefor at least 291full day. PREVENTION  An annual influenza vaccination (flu shot) is the best way to avoid getting influenza. An annual flu shot is now routinely recommended for all adults in the [place] SEEK MEDICAL CARE IF:  You experiencechest pain, yourcough worsens,or you producemore mucus.  Youhave nausea,vomiting, ordiarrhea.  Your fever returns or gets worse. SEEK IMMEDIATE MEDICAL CARE IF:  You havetrouble breathing, you become short of breath,or your skin ornails becomebluish.  You have severe painor stiffnessin the neck.  You develop a sudden headache, or pain in the face or ear.  You have nausea or vomiting that you cannot control. MAKE SURE YOU:   Understand these instructions.  Will watch your condition.  Will get help right away if you are not doing well or get worse. Document Released: 04/09/2000 Document Revised: 08/27/2013 Document Reviewed: 07/12/2011 [facility] Patient Information 2015 [place]. This information is not intended to replace advice given to you by your health care provider. Make sure you discuss any questions you have with your health care provider.  Viral Infections A viral infection can be caused by  different types of viruses.Most viral infections are not serious and resolve on their own. However, some infections may cause severe symptoms and may lead to further complications. SYMPTOMS Viruses can frequently cause:  Minor sore  throat.  Aches and pains.  Headaches.  Runny nose.  Different types of rashes.  Watery eyes.  Tiredness.  Cough.  Loss of appetite.  Gastrointestinal infections, resulting in nausea, vomiting, and diarrhea. These symptoms do not respond to antibiotics because the infection is not caused by bacteria. However, you might catch a bacterial infection following the viral infection. This is sometimes called a "superinfection." Symptoms of such a bacterial infection may include:  Worsening sore throat with pus and difficulty swallowing.  Swollen neck glands.  Chills and a high or persistent fever.  Severe headache.  Tenderness over the sinuses.  Persistent overall ill feeling (malaise), muscle aches, and tiredness (fatigue).  Persistent cough.  Yellow, green, or brown mucus production with coughing. HOME CARE INSTRUCTIONS   Only take over-the-counter or prescription medicines for pain, discomfort, diarrhea, or fever as directed by your caregiver.  Drink enough water and fluids to keep your urine clear or pale yellow. Sports drinks can provide valuable electrolytes, sugars, and hydration.  Get plenty of rest and maintain proper nutrition. Soups and broths with crackers or rice are fine. SEEK IMMEDIATE MEDICAL CARE IF:   You have severe headaches, shortness of breath, chest pain, neck pain, or an unusual rash.  You have uncontrolled vomiting, diarrhea, or you are unable to keep down fluids.  You or your child has an oral temperature above 102 F (38.9 C), not controlled by medicine.  Your baby is older than 3 months with a rectal temperature of 102 F (38.9 C) or higher.  Your baby is 583 months old or younger with a rectal temperature of 100.4 F (38 C) or higher. MAKE SURE YOU:   Understand these instructions.  Will watch your condition.  Will get help right away if you are not doing well or get worse. Document Released: 01/20/2005 Document Revised: 07/05/2011  Document Reviewed: 08/17/2010 [facility]ExitCare Patient Information 2015 [place], [place]. This information is not intended to replace advice given to you by your health care provider. Make sure you discuss any questions you have with your health care provider.

## 2014-07-10 NOTE — ED Notes (Signed)
Pt verbalizes understanding of d/c instructions,

## 2014-07-10 NOTE — ED Notes (Signed)
Pt reports sore throat with subjective temps x Monday with body aches and cough producing yellow sputum.

## 2014-07-10 NOTE — ED Provider Notes (Addendum)
TIME SEEN: 7:48 AM  CHIEF COMPLAINT: Fever, sore throat, body aches, cough  HPI: Pt is a 39 y.o. male with history of asthma who presents to the emergency department with complaints of sore throat that started Monday morning, 2 days ago with fever at home of 101.6, cough with yellow sputum production and body aches. States his children wife had similar symptoms 3 weeks ago. Denies any travel outside of the country. Denies feeling short of breath. States he has been running every day to try to "sweat it out". No nausea, vomiting or diarrhea. No rash. No headache, neck pain or neck stiffness. Did have an influenza vaccination in 2015.  ROS: See HPI Constitutional:  fever  Eyes: no drainage  ENT:  runny nose   Cardiovascular:  no chest pain  Resp: no SOB  GI: no vomiting GU: no dysuria Integumentary: no rash  Allergy: no hives  Musculoskeletal: no leg swelling  Neurological: no slurred speech ROS otherwise negative  PAST MEDICAL HISTORY/PAST SURGICAL HISTORY:  Past Medical History  Diagnosis Date  . Asthma     MEDICATIONS:  Prior to Admission medications   Medication Sig Start Date End Date Taking? Authorizing Provider  albuterol (PROVENTIL HFA;VENTOLIN HFA) 108 (90 BASE) MCG/ACT inhaler Inhale 2 puffs into the lungs every 6 (six) hours as needed. For shortness of breath    Historical Provider, MD  diazepam (VALIUM) 5 MG tablet One tablet po q 6 prn muscle spasm. 10/14/11   Elson AreasLeslie K Sofia, PA-C  Diphenhyd-Hydrocort-Nystatin (FIRST-DUKES MOUTHWASH) SUSP Use as directed 5 mLs in the mouth or throat 3 (three) times daily. 02/12/13   Hannah Muthersbaugh, PA-C  ibuprofen (ADVIL,MOTRIN) 200 MG tablet Take 600 mg by mouth every 6 (six) hours as needed. For pain    Historical Provider, MD  ibuprofen (ADVIL,MOTRIN) 800 MG tablet Take 1 tablet (800 mg total) by mouth 3 (three) times daily. 02/12/14   Elwin MochaBlair Walden, MD  Multiple Vitamin (MULTIVITAMIN WITH MINERALS) TABS Take 1 tablet by mouth  daily.    Historical Provider, MD    ALLERGIES:  Allergies  Allergen Reactions  . Shellfish Allergy Anaphylaxis and Itching  . Morphine And Related Other (See Comments)    Side effects are extreme, advised by his doctor not to take    SOCIAL HISTORY:  History  Substance Use Topics  . Smoking status: Never Smoker   . Smokeless tobacco: Not on file  . Alcohol Use: No    FAMILY HISTORY: No family history on file.  EXAM: BP 109/52 mmHg  Pulse 68  Temp(Src) 97.6 F (36.4 C) (Oral)  Resp 16  Ht 5\' 7"  (1.702 m)  Wt 178 lb (80.74 kg)  BMI 27.87 kg/m2  SpO2 98% CONSTITUTIONAL: Alert and oriented and responds appropriately to questions. Well-appearing; well-nourished HEAD: Normocephalic EYES: Conjunctivae clear, PERRL ENT: normal nose; no rhinorrhea; moist mucous membranes; pharynx without lesions noted, no tonsillar hypertrophy or exudate, no uvular deviation, no trismus or drooling, normal phonation, no sign of Ludwig angina, no sign of dental abscess NECK: Supple, no meningismus, no LAD  CARD: RRR; S1 and S2 appreciated; no murmurs, no clicks, no rubs, no gallops RESP: Normal chest excursion without splinting or tachypnea; breath sounds clear and equal bilaterally; no wheezes, no rhonchi, no rales, no hypoxia or respiratory distress, speaking full sentences, good aeration diffusely ABD/GI: Normal bowel sounds; non-distended; soft, non-tender, no rebound, no guarding BACK:  The back appears normal and is non-tender to palpation, there is no CVA tenderness EXT: Normal  ROM in all joints; non-tender to palpation; no edema; normal capillary refill; no cyanosis    SKIN: Normal color for age and race; warm, no rash NEURO: Moves all extremities equally PSYCH: The patient's mood and manner are appropriate. Grooming and personal hygiene are appropriate.  MEDICAL DECISION MAKING: Patient here with flulike symptoms. Strep swab has been sent. We'll also obtain chest x-ray. Will give  ibuprofen for pain. His exam is benign. He is hemodynamically stable, nontoxic, well-hydrated appearing.   ED PROGRESS: Patient's strep test is negative. Chest x-ray clear. We'll discharge home with prescription for Tamiflu, Zofran, ibuprofen, guaifenesin with codeine. Discussed return precautions. We'll provide work note. Patient verbalizes understanding and is comfortable with plan.     Layla Maw Erbie Arment, DO 07/10/14 0815   Patient has a listed allergy to morphine. States it makes him feel like his vision is changing and that he is very agitated. He has had codeine before and has had no difficulty.  Layla Maw Valeriano Bain, DO 07/10/14 [phone]

## 2014-07-12 LAB — CULTURE, GROUP A STREP: STREP A CULTURE: NEGATIVE

## 2015-03-21 ENCOUNTER — Emergency Department (HOSPITAL_BASED_OUTPATIENT_CLINIC_OR_DEPARTMENT_OTHER)
Admission: EM | Admit: 2015-03-21 | Discharge: 2015-03-21 | Disposition: A | Discharge status: Home / Self Care | Payer: PRIVATE HEALTH INSURANCE | Attending: Emergency Medicine | Admitting: Emergency Medicine

## 2015-03-21 ENCOUNTER — Encounter (HOSPITAL_BASED_OUTPATIENT_CLINIC_OR_DEPARTMENT_OTHER): Payer: Self-pay | Admitting: Emergency Medicine

## 2015-03-21 DIAGNOSIS — M25511 Pain in right shoulder: Secondary | ICD-10-CM | POA: Insufficient documentation

## 2015-03-21 DIAGNOSIS — Z79899 Other long term (current) drug therapy: Secondary | ICD-10-CM | POA: Diagnosis not present

## 2015-03-21 DIAGNOSIS — J45909 Unspecified asthma, uncomplicated: Secondary | ICD-10-CM | POA: Insufficient documentation

## 2015-03-21 MED ORDER — NAPROXEN SODIUM 550 MG PO TABS: ORAL_TABLET | ORAL | Status: DC

## 2015-03-21 MED ORDER — NAPROXEN 250 MG PO TABS
500.0000 mg | ORAL_TABLET | Freq: Once | ORAL | Status: AC
  Administered 2015-03-21: 500 mg via ORAL
  Filled 2015-03-21: qty 2

## 2015-03-21 NOTE — ED Notes (Signed)
Pain in right shoulder for a year.  Worse for past couple weeks.  Can bench press but movements that require a twisting of the shoulder cause pain.

## 2015-03-21 NOTE — ED Provider Notes (Signed)
CSN: [id number]     Arrival date & time 03/21/15  0036 History   First MD Initiated Contact with Patient 03/21/15 0132     Chief Complaint  Patient presents with  . Shoulder Pain     (Consider location/radiation/quality/duration/timing/severity/associated sxs/prior Treatment) HPI  This is a 39 year old male who injured his right shoulder not climbing about 2-1/2 years ago. He has had intermittent pain in the right shoulder since. The pain worsened over the last month. And he was having moderate to severe pain in the right shoulder most notably when he rotates his arm to, say, open a door. He denies a new acute injury. There is no functional or sensory deficit distally. He has not taken anything for the pain.  Past Medical History  Diagnosis Date  . Asthma    Past Surgical History  Procedure Laterality Date  . Back surgery    . Knee surgery     No family history on file. Social History  Substance Use Topics  . Smoking status: Never Smoker   . Smokeless tobacco: None  . Alcohol Use: Yes     Comment: occ    Review of Systems  All other systems reviewed and are negative.   Allergies  Shellfish allergy and Morphine and related  Home Medications   Prior to Admission medications   Medication Sig Start Date End Date Taking? Authorizing Provider  albuterol (PROVENTIL HFA;VENTOLIN HFA) 108 (90 BASE) MCG/ACT inhaler Inhale 2 puffs into the lungs every 6 (six) hours as needed. For shortness of breath    Historical Provider, MD  albuterol (PROVENTIL HFA;VENTOLIN HFA) 108 (90 BASE) MCG/ACT inhaler Inhale 2 puffs into the lungs every 4 (four) hours as needed for wheezing or shortness of breath. 07/10/14   Kristen N Ward, DO  guaiFENesin-codeine 100-10 MG/5ML syrup Take 5 mLs by mouth every 4 (four) hours as needed for cough. 07/10/14   Kristen N Ward, DO  ibuprofen (ADVIL,MOTRIN) 800 MG tablet Take 1 tablet (800 mg total) by mouth every 8 (eight) hours as needed for mild pain. 07/10/14    Kristen N Ward, DO  Multiple Vitamin (MULTIVITAMIN WITH MINERALS) TABS Take 1 tablet by mouth daily.    Historical Provider, MD  ondansetron (ZOFRAN ODT) 4 MG disintegrating tablet Take 1 tablet (4 mg total) by mouth every 8 (eight) hours as needed for nausea or vomiting. 07/10/14   Kristen N Ward, DO  oseltamivir (TAMIFLU) 75 MG capsule Take 1 capsule (75 mg total) by mouth every 12 (twelve) hours. 07/10/14   Kristen N Ward, DO   BP 115/80 mmHg  Pulse 92  Temp(Src) 98.3 F (36.8 C) (Oral)  Resp 16  Ht  (1.727 m)  Wt 180 lb (81.647 kg)  BMI 27.38 kg/m2  SpO2 94%   Physical Exam  General: Well-developed, well-nourished male in no acute distress; appearance consistent with age of record HENT: normocephalic; atraumatic Eyes: pupils equal, round and reactive to light; extraocular muscles intact Neck: supple Heart: regular rate and rhythm Lungs: clear to auscultation bilaterally Abdomen: soft; nondistended; nontender Extremities: No deformity; full range of motion; pain on movement of right shoulder, notably on rotation, without tenderness to palpation Neurologic: Awake, alert and oriented; motor function intact in all extremities and symmetric; no facial droop Skin: Warm and dry Psychiatric: Normal mood and affect    ED Course  Procedures (including critical care time)   MDM  Suspect rotator cuff pain. We'll place on anti-inflammatory and refer to orthopedics.  Oral Remache,  MD 03/21/15 0140

## 2016-10-15 ENCOUNTER — Encounter (HOSPITAL_COMMUNITY): Payer: Self-pay | Admitting: Emergency Medicine

## 2016-10-15 ENCOUNTER — Emergency Department (HOSPITAL_COMMUNITY): Payer: Medicaid Other

## 2016-10-15 ENCOUNTER — Emergency Department (HOSPITAL_COMMUNITY)
Admission: EM | Admit: 2016-10-15 | Discharge: 2016-10-15 | Disposition: A | Discharge status: Home / Self Care | Payer: Medicaid Other | Attending: Emergency Medicine | Admitting: Emergency Medicine

## 2016-10-15 DIAGNOSIS — Z79899 Other long term (current) drug therapy: Secondary | ICD-10-CM | POA: Diagnosis not present

## 2016-10-15 DIAGNOSIS — R309 Painful micturition, unspecified: Secondary | ICD-10-CM | POA: Diagnosis not present

## 2016-10-15 DIAGNOSIS — J45909 Unspecified asthma, uncomplicated: Secondary | ICD-10-CM | POA: Diagnosis not present

## 2016-10-15 DIAGNOSIS — R109 Unspecified abdominal pain: Secondary | ICD-10-CM | POA: Diagnosis present

## 2016-10-15 LAB — URINALYSIS, ROUTINE W REFLEX MICROSCOPIC
Bilirubin Urine: NEGATIVE
GLUCOSE, UA: NEGATIVE mg/dL
HGB URINE DIPSTICK: NEGATIVE
Ketones, ur: NEGATIVE mg/dL
LEUKOCYTES UA: NEGATIVE
Nitrite: NEGATIVE
PROTEIN: NEGATIVE mg/dL
SPECIFIC GRAVITY, URINE: 1.026 (ref 1.005–1.030)
pH: 5 (ref 5.0–8.0)

## 2016-10-15 LAB — CBC WITH DIFFERENTIAL/PLATELET
BASOS ABS: 0 10*3/uL (ref 0.0–0.1)
Basophils Relative: 0 %
EOS PCT: 5 %
Eosinophils Absolute: 0.3 10*3/uL (ref 0.0–0.7)
HEMATOCRIT: 41.5 % (ref 39.0–52.0)
Hemoglobin: 14.2 g/dL (ref 13.0–17.0)
LYMPHS PCT: 27 %
Lymphs Abs: 1.5 10*3/uL (ref 0.7–4.0)
MCH: 30.5 pg (ref 26.0–34.0)
MCHC: 34.2 g/dL (ref 30.0–36.0)
MCV: 89.1 fL (ref 78.0–100.0)
MONO ABS: 0.5 10*3/uL (ref 0.1–1.0)
Monocytes Relative: 8 %
NEUTROS ABS: 3.5 10*3/uL (ref 1.7–7.7)
Neutrophils Relative %: 60 %
PLATELETS: 229 10*3/uL (ref 150–400)
RBC: 4.66 MIL/uL (ref 4.22–5.81)
RDW: 13.1 % (ref 11.5–15.5)
WBC: 5.8 10*3/uL (ref 4.0–10.5)

## 2016-10-15 LAB — LIPASE, BLOOD: Lipase: 37 U/L (ref 11–51)

## 2016-10-15 LAB — I-STAT CHEM 8, ED
BUN: 13 mg/dL (ref 6–20)
CALCIUM ION: 1.2 mmol/L (ref 1.15–1.40)
CHLORIDE: 101 mmol/L (ref 101–111)
Creatinine, Ser: 1.2 mg/dL (ref 0.61–1.24)
Glucose, Bld: 96 mg/dL (ref 65–99)
HCT: 43 % (ref 39.0–52.0)
Hemoglobin: 14.6 g/dL (ref 13.0–17.0)
POTASSIUM: 4.4 mmol/L (ref 3.5–5.1)
SODIUM: 141 mmol/L (ref 135–145)
TCO2: 29 mmol/L (ref 0–100)

## 2016-10-15 LAB — HEPATIC FUNCTION PANEL
ALBUMIN: 4.3 g/dL (ref 3.5–5.0)
ALK PHOS: 47 U/L (ref 38–126)
ALT: 18 U/L (ref 17–63)
AST: 23 U/L (ref 15–41)
BILIRUBIN TOTAL: 0.7 mg/dL (ref 0.3–1.2)
Bilirubin, Direct: <0.1 mg/dL — ABNORMAL LOW (ref 0.1–0.5)
TOTAL PROTEIN: 6.8 g/dL (ref 6.5–8.1)

## 2016-10-15 MED ORDER — ONDANSETRON HCL 4 MG/2ML IJ SOLN
4.0000 mg | Freq: Once | INTRAMUSCULAR | Status: AC
  Administered 2016-10-15: 4 mg via INTRAVENOUS
  Filled 2016-10-15: qty 2

## 2016-10-15 MED ORDER — IBUPROFEN 600 MG PO TABS: 600.0000 mg | ORAL_TABLET | Freq: Four times a day (QID) | ORAL | 0 refills | Status: DC | PRN

## 2016-10-15 MED ORDER — IOPAMIDOL (ISOVUE-300) INJECTION 61%
100.0000 mL | Freq: Once | INTRAVENOUS | Status: AC | PRN
  Administered 2016-10-15: 100 mL via INTRAVENOUS

## 2016-10-15 MED ORDER — IOPAMIDOL (ISOVUE-300) INJECTION 61%
INTRAVENOUS | Status: AC
  Filled 2016-10-15: qty 100

## 2016-10-15 MED ORDER — CYCLOBENZAPRINE HCL 10 MG PO TABS: 10.0000 mg | ORAL_TABLET | Freq: Two times a day (BID) | ORAL | 0 refills | Status: DC | PRN

## 2016-10-15 MED ORDER — FENTANYL CITRATE (PF) 100 MCG/2ML IJ SOLN
50.0000 ug | INTRAMUSCULAR | Status: AC | PRN
  Administered 2016-10-15 (×2): 50 ug via INTRAVENOUS
  Filled 2016-10-15 (×2): qty 2

## 2016-10-15 MED FILL — CYCLOBENZAPRINE 10 MG TABLE: 10 | 10 days supply | Qty: 20 | Fill #0

## 2016-10-15 NOTE — ED Provider Notes (Signed)
[facility]-EMERGENCY DEPT Provider Note   CSN: [id number] Arrival date & time: 10/15/16  4098     History   Chief Complaint Chief Complaint  Patient presents with  . Flank Pain    HPI COREE BRAME is a 41 y.o. male.  HPI   41 year old male presenting complaining of flank pain. Patient states he has history of chronic back pain with multiple back surgeries.  Here today with flank pain radiates to groin x 3 weeks.  Pain has been constant, gradually getting worse.  Pain described as a weighted pain, severe 10/10, cannot get comfortable but worse with certain movement.  Sometimes relief temporarily with change in position.  Report burning pain with urination, report yellow urine but no blood.  sxs worse after drinking soda.  Denies fever, chills, N/V, leg pain, new numbness/tingling, abd pain, constipation, diarrhea.  Is sexually active but not concern for STD. Denies testicular pain, swelling, or discharge. LBM yesterday.  No prior hx of kidney stone.  Still has intact appendix.  Report no changes in appetite.  Patient felt different from his chronic back pain. He admits to drinking sodas and tea on a consistent basis and think it may have attributed to his symptoms.   Past Medical History:  Diagnosis Date  . Asthma     There are no active problems to display for this patient.   Past Surgical History:  Procedure Laterality Date  . BACK SURGERY    . KNEE SURGERY         Home Medications    Prior to Admission medications   Medication Sig Start Date End Date Taking? Authorizing Provider  albuterol (PROVENTIL HFA;VENTOLIN HFA) 108 (90 BASE) MCG/ACT inhaler Inhale 2 puffs into the lungs every 6 (six) hours as needed. For shortness of breath    [provider]  Multiple Vitamin (MULTIVITAMIN WITH MINERALS) TABS Take 1 tablet by mouth daily.    [provider]  naproxen sodium (ANAPROX DS) 550 MG tablet Take 1 tablet every 12 hours for shoulder pain. Best taken with  a meal. 03/21/15   Molpus, Jonny Ruiz, MD    Family History History reviewed. No pertinent family history.  Social History Social History  Substance Use Topics  . Smoking status: Never Smoker  . Smokeless tobacco: Not on file  . Alcohol use Yes     Comment: occ     Allergies   Shellfish allergy and Morphine and related   Review of Systems Review of Systems  All other systems reviewed and are negative.    Physical Exam Updated Vital Signs BP 122/74 (BP Location: Left Arm)   Pulse 69   Temp 97.6 F (36.4 C) (Oral)   Resp 20   Ht 5\' 8"  (1.727 m)   Wt 79.4 kg (175 lb)   SpO2 100%   BMI 26.61 kg/m   Physical Exam  Constitutional: He appears well-developed and well-nourished. No distress.  HENT:  Head: Atraumatic.  Eyes: Conjunctivae are normal.  Neck: Neck supple.  Cardiovascular: Normal rate and regular rhythm.   Pulmonary/Chest: Effort normal and breath sounds normal.  Abdominal: Soft. Bowel sounds are normal. He exhibits no distension. There is tenderness (Marginalized abdominal tenderness most significant to right lower quadrant on palpation without guarding or rebound tenderness.).  Right CVA tenderness  Genitourinary:  Genitourinary Comments: Chaperone present during exam. No inguinal lymphadenopathy or inguinal hernia noted. Normal circumcised penis free of lesion or rash. Testicles are nontender with normal lie. Scrotum is soft.  Musculoskeletal:  No significant midline spine tenderness  Neurological: He is alert.  Skin: No rash noted.  Psychiatric: He has a normal mood and affect.  Nursing note and vitals reviewed.    ED Treatments / Results  Labs (all labs ordered are listed, but only abnormal results are displayed) Labs Reviewed  HEPATIC FUNCTION PANEL - Abnormal; Notable for the following:       Result Value   Bilirubin, Direct <0.1 (*)    All other components within normal limits  URINALYSIS, ROUTINE W REFLEX MICROSCOPIC  LIPASE, BLOOD  CBC WITH  DIFFERENTIAL/PLATELET  CBC WITH DIFFERENTIAL/PLATELET  I-STAT CHEM 8, ED    EKG  EKG Interpretation None       Radiology Ct Abdomen Pelvis W Contrast  Result Date: 10/15/2016 CLINICAL DATA:  41 year old male with a history of flank pain EXAM: CT ABDOMEN AND PELVIS WITH CONTRAST TECHNIQUE: Multidetector CT imaging of the abdomen and pelvis was performed using the standard protocol following bolus administration of intravenous contrast. CONTRAST:  ISOVUE-300 IOPAMIDOL (ISOVUE-300) INJECTION 61% COMPARISON:  None. FINDINGS: Lower chest: No acute abnormality. Hepatobiliary: No focal liver abnormality is seen. No gallstones, gallbladder wall thickening, or biliary dilatation. Pancreas: Unremarkable. No pancreatic ductal dilatation or surrounding inflammatory changes. Spleen: Normal in size without focal abnormality. Adrenals/Urinary Tract: Unremarkable adrenal glands. Unremarkable appearance of the bilateral kidneys with no hydronephrosis or nephrolithiasis. Unremarkable course of the bilateral ureters. Unremarkable urinary bladder. Stomach/Bowel: Unremarkable appearance of the stomach. Unremarkable small bowel. No abnormal distention or transition point. Normal appendix. No significant diverticular disease. Vascular/Lymphatic: Minimal calcifications of the iliac system. No dissection. No aneurysm. Mesenteric vessels and renal arteries patent. Proximal femoral vasculature patent. No adenopathy. Reproductive: Unremarkable pelvic structures Other: No abdominal wall hernia. Musculoskeletal: No displaced fracture. Degenerative changes at L5-S1 with disc space narrowing. Vacuum disc phenomenon at L4-L5. IMPRESSION: No acute finding. Electronically Signed   By: Gilmer Mor D.O.   On: 10/15/2016 09:30    Procedures Procedures (including critical care time)  Medications Ordered in ED Medications  fentaNYL (SUBLIMAZE) injection 50 mcg (50 mcg Intravenous Given 10/15/16 0815)  iopamidol (ISOVUE-300)  61 % injection (not administered)  ondansetron (ZOFRAN) injection 4 mg (4 mg Intravenous Given 10/15/16 0808)  iopamidol (ISOVUE-300) 61 % injection 100 mL (100 mLs Intravenous Contrast Given 10/15/16 0915)     Initial Impression / Assessment and Plan / ED Course  I have reviewed the triage vital signs and the nursing notes.  Pertinent labs & imaging results that were available during my care of the patient were reviewed by me and considered in my medical decision making (see chart for details).     BP 122/74 (BP Location: Left Arm)   Pulse 69   Temp 97.6 F (36.4 C) (Oral)   Resp 20   Ht 5\' 8"  (1.727 m)   Wt 79.4 kg (175 lb)   SpO2 100%   BMI 26.61 kg/m    Final Clinical Impressions(s) / ED Diagnoses   Final diagnoses:  Right flank pain    New Prescriptions New Prescriptions   CYCLOBENZAPRINE (FLEXERIL) 10 MG TABLET    Take 1 tablet (10 mg total) by mouth 2 (two) times daily as needed for muscle spasms.   IBUPROFEN (ADVIL,MOTRIN) 600 MG TABLET    Take 1 tablet (600 mg total) by mouth every 6 (six) hours as needed.   7:54 AM Patient here with recurrent right flank pain which radiates to his right groin ongoing for the past 3 weeks. No history  of kidney stones in the past. UA shows no signs of infection and no blood in his urine. He does have some tenderness on abdominal exam. Giving the duration of his symptoms and his discomfort, plan to obtain an abdominal pelvic CT scan for further evaluation. Pain medication given.  10:11 AM Labs are reassuring, no leukocytosis, or anemia. Normal hepatic function panel. Normal lipase. Urine shows no signs of infection and no hematuria. Abdominal and pelvis CT scan without any acute finding. Suspect pain is likely muscle skeletal. Patient discharged with NSAIDs and muscle relaxant and outpatient follow-up. Return precaution discussed.   Fayrene Helperran, Eberardo Demello, PA-C 10/15/16 1027    Lavera GuiseLiu, Dana Duo, MD 10/16/16 [phone]

## 2016-10-15 NOTE — ED Triage Notes (Signed)
Pt reports having right flank pain with radiation to the groin.

## 2016-10-15 NOTE — Discharge Instructions (Signed)
Please follow-up with your provider for further management of your pain. Take anti-inflammatory medication and muscle relaxants as it will help with the pain. Return to ER if you have any other concern.

## 2016-10-15 NOTE — ED Notes (Signed)
EDPA Provider at bedside. 

## 2016-10-15 NOTE — ED Notes (Signed)
Patient transported to CT 

## 2016-10-17 ENCOUNTER — Emergency Department (HOSPITAL_COMMUNITY): Payer: Medicaid Other

## 2016-10-17 ENCOUNTER — Encounter (HOSPITAL_COMMUNITY): Payer: Self-pay | Admitting: Emergency Medicine

## 2016-10-17 ENCOUNTER — Emergency Department (HOSPITAL_COMMUNITY)
Admission: EM | Admit: 2016-10-17 | Discharge: 2016-10-17 | Disposition: A | Discharge status: Home / Self Care | Payer: Medicaid Other | Attending: Emergency Medicine | Admitting: Emergency Medicine

## 2016-10-17 DIAGNOSIS — Z79899 Other long term (current) drug therapy: Secondary | ICD-10-CM | POA: Diagnosis not present

## 2016-10-17 DIAGNOSIS — M5441 Lumbago with sciatica, right side: Secondary | ICD-10-CM | POA: Diagnosis not present

## 2016-10-17 DIAGNOSIS — M5442 Lumbago with sciatica, left side: Secondary | ICD-10-CM | POA: Diagnosis not present

## 2016-10-17 DIAGNOSIS — J45909 Unspecified asthma, uncomplicated: Secondary | ICD-10-CM | POA: Diagnosis not present

## 2016-10-17 DIAGNOSIS — M545 Low back pain: Secondary | ICD-10-CM | POA: Diagnosis present

## 2016-10-17 MED ORDER — HYDROCODONE-ACETAMINOPHEN 5-325 MG PO TABS: 1.0000 | ORAL_TABLET | ORAL | 0 refills | Status: DC | PRN

## 2016-10-17 MED ORDER — DEXAMETHASONE SODIUM PHOSPHATE 10 MG/ML IJ SOLN
10.0000 mg | Freq: Once | INTRAMUSCULAR | Status: AC
  Administered 2016-10-17: 10 mg via INTRAMUSCULAR
  Filled 2016-10-17: qty 1

## 2016-10-17 MED ORDER — OXYCODONE-ACETAMINOPHEN 5-325 MG PO TABS
1.0000 | ORAL_TABLET | Freq: Once | ORAL | Status: AC
  Administered 2016-10-17: 1 via ORAL
  Filled 2016-10-17: qty 1

## 2016-10-17 MED ORDER — DIAZEPAM 5 MG PO TABS: 5.0000 mg | ORAL_TABLET | Freq: Two times a day (BID) | ORAL | 0 refills | Status: DC

## 2016-10-17 MED ORDER — DIAZEPAM 5 MG PO TABS
5.0000 mg | ORAL_TABLET | Freq: Once | ORAL | Status: AC
  Administered 2016-10-17: 5 mg via ORAL
  Filled 2016-10-17: qty 1

## 2016-10-17 NOTE — ED Triage Notes (Signed)
Patient is complaining of legs are getting numb and he thinks it sciatic nerve. Patient was in here earlier with another complaint that he thought was the problem.

## 2016-10-17 NOTE — ED Provider Notes (Signed)
[facility]-EMERGENCY DEPT Provider Note   CSN: [id number] Arrival date & time: 10/17/16  0446     History   Chief Complaint Chief Complaint  Patient presents with  . Numbness  . Leg Pain    HPI Jacob Church is a 41 y.o. male.  HPI   Patient is a 41 year old male with history of asthma who presents the ED with complaint of worsening right lower back pain. Patient reports over the past 3 weeks he has been having pain to his lower back but notes over the past 3 days it has become constant and significantly worsened. Patient reports pain radiates down the back of both of his legs. Reports pain is worse with movement or bending. Endorses associated tingling and decreased sensation down the back of bilateral legs. Denies any recent fall, trauma, injury or heavy lifting. Patient reports having prior surgeries to his L3, 4, 5 lumbar spine (most recent in 2011) and notes his pain today feels similar to episodes he has had in the past. Pt denies fever, tingling, saddle anesthesia, loss of bowel or bladder, weakness, CP, SOB, abdominal pain, vomiting, diarrhea, IVDU, cancer or recent spinal manipulation. He states he was initially seen in the ED a few days ago for his pain which he thought was due to his kidneys but states his workup was negative and he was discharged home with ibuprofen and Flexeril which she has been taking without relief. Denies taking any other medications at home for his symptoms.  Past Medical History:  Diagnosis Date  . Asthma     There are no active problems to display for this patient.   Past Surgical History:  Procedure Laterality Date  . BACK SURGERY    . KNEE SURGERY         Home Medications    Prior to Admission medications   Medication Sig Start Date End Date Taking? Authorizing Provider  albuterol (PROVENTIL HFA;VENTOLIN HFA) 108 (90 BASE) MCG/ACT inhaler Inhale 2 puffs into the lungs every 6 (six) hours as needed. For shortness of breath   Yes [provider]  cyclobenzaprine (FLEXERIL) 10 MG tablet Take 1 tablet (10 mg total) by mouth 2 (two) times daily as needed for muscle spasms. 10/15/16  Yes Fayrene Helper, PA-C  ibuprofen (ADVIL,MOTRIN) 600 MG tablet Take 1 tablet (600 mg total) by mouth every 6 (six) hours as needed. 10/15/16  Yes Fayrene Helper, PA-C  Multiple Vitamin (MULTIVITAMIN WITH MINERALS) TABS Take 1 tablet by mouth daily.   Yes [provider]  diazepam (VALIUM) 5 MG tablet Take 1 tablet (5 mg total) by mouth 2 (two) times daily. 10/17/16   Barrett Henle, PA-C  HYDROcodone-acetaminophen (NORCO/VICODIN) 5-325 MG tablet Take 1 tablet by mouth every 4 (four) hours as needed. 10/17/16   Barrett Henle, PA-C  naproxen sodium (ANAPROX DS) 550 MG tablet Take 1 tablet every 12 hours for shoulder pain. Best taken with a meal. Patient not taking: Reported on 10/15/2016 03/21/15   Molpus, Jonny Ruiz, MD    Family History History reviewed. No pertinent family history.  Social History Social History  Substance Use Topics  . Smoking status: Never Smoker  . Smokeless tobacco: Never Used  . Alcohol use Yes     Comment: occ     Allergies   Shellfish allergy and Morphine and related   Review of Systems Review of Systems  Musculoskeletal: Positive for back pain.  Neurological: Positive for numbness.  All other systems reviewed and are negative.  Physical Exam Updated Vital Signs BP 125/76 (BP Location: Right Arm)   Pulse 61   Temp 98.6 F (37 C) (Oral)   Resp 17   Ht 5\' 7"  (1.702 m)   Wt 79.4 kg (175 lb)   SpO2 100%   BMI 27.41 kg/m   Physical Exam  Constitutional: He is oriented to person, place, and time. He appears well-developed and well-nourished.  Pt is sitting on the ground leaning over a chair and appears to be in discomfort.  HENT:  Head: Normocephalic and atraumatic.  Eyes: Conjunctivae and EOM are normal. Right eye exhibits no discharge. Left eye exhibits no discharge. No scleral  icterus.  Neck: Normal range of motion. Neck supple.  Cardiovascular: Normal rate, regular rhythm, normal heart sounds and intact distal pulses.   Pulmonary/Chest: Effort normal and breath sounds normal. No respiratory distress. He has no wheezes. He has no rales. He exhibits no tenderness.  Abdominal: Soft. He exhibits no distension. There is no tenderness.  Musculoskeletal: Normal range of motion. He exhibits tenderness. He exhibits no edema or deformity.  No midline C, T, or L tenderness. TTP over right lumbar paraspinal muscles. Full range of motion of neck and back. Full range of motion of bilateral upper and lower extremities, with 5/5 strength. Sensation intact. 2+ radial and PT pulses. Cap refill <2 seconds. Patient able to stand and ambulate without assistance. Positive straight leg raise bilaterally.    Neurological: He is alert and oriented to person, place, and time. He has normal strength. He displays normal reflexes. No sensory deficit. Gait normal.  Skin: Skin is warm and dry. He is not diaphoretic.  Nursing note and vitals reviewed.    ED Treatments / Results  Labs (all labs ordered are listed, but only abnormal results are displayed) Labs Reviewed - No data to display  EKG  EKG Interpretation None       Radiology Dg Lumbar Spine Complete  Result Date: 10/17/2016 CLINICAL DATA:  Bilateral leg numbness and low back pain 3 weeks. EXAM: LUMBAR SPINE - COMPLETE 4+ VIEW COMPARISON:  06/27/2011 FINDINGS: Vertebral body alignment and heights are normal. No evidence of compression fracture or spondylolisthesis. Mild facet arthropathy over the lower lumbar spine. Mild disc space narrowing at the L4-5 and L5-S1 levels. Very minimal spondylosis of the lower lumbar spine. 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 Electronically Signed   By: Elberta Fortisaniel  Boyle M.D.   On: 10/17/2016 08:10    Procedures Procedures  (including critical care time)  Medications Ordered in ED Medications  oxyCODONE-acetaminophen (PERCOCET/ROXICET) 5-325 MG per tablet 1 tablet (1 tablet Oral Given 10/17/16 0804)  diazepam (VALIUM) tablet 5 mg (5 mg Oral Given 10/17/16 0804)  dexamethasone (DECADRON) injection 10 mg (10 mg Intramuscular Given 10/17/16 0944)     Initial Impression / Assessment and Plan / ED Course  I have reviewed the triage vital signs and the nursing notes.  Pertinent labs & imaging results that were available during my care of the patient were reviewed by me and considered in my medical decision making (see chart for details).     Patient presents with worsening right lower back pain that has been present for the past 3 weeks. Reports pain radiates down the backside of both of his legs. Reports history of similar episodes of back pain in the past and notes he has had previous surgeries to his lumbar spine, most recent surgery in 2011.  No back pain red flags. No relief with ibuprofen or Flexeril. Pain worse with movement. VSS. Exam revealed tenderness over right lumbar paraspinal muscles, no midline spinal tenderness, positive straight leg raise bilaterally. Patient able to stand and ambulate but endorses pain. Bilateral lower extremities neurovascularly intact with 5 out of 5 strength. Patient given pain meds, muscle relaxant and steroids in the ED.  Chart review shows patient was seen in the ED on 10/15/16 for similar back pain. Labs and urine unremarkable. CT abdomen negative. Patient's pain suspected to be musculoskeletal and was discharged home with ibuprofen and Flexeril with outpatient follow-up.  Lumbar spine x-ray revealed mild spondylosis of lower lumbar spine with mild disc disease at L4-5 and L5-S1. On reevaluation patient reports improvement of pain. Suspect patient's symptoms are likely due to his history of degenerative disc disease with associated muscle strain and evidence of sciatica on exam. No  concern for cauda equina, I do not feel that further imaging/MRI is warranted at this time. Discussed results and plan for discharge. Plan to discharge patient home with pain meds and a new muscle relaxant, advised to discontinue Flexeril but continue taking his prescription of ibuprofen. Discussed symptomatic treatment. Advised patient to call his PCP on Monday to schedule follow-up evaluation for next week. Discussed or return precautions.  Final Clinical Impressions(s) / ED Diagnoses   Final diagnoses:  Acute right-sided low back pain with bilateral sciatica    New Prescriptions New Prescriptions   DIAZEPAM (VALIUM) 5 MG TABLET    Take 1 tablet (5 mg total) by mouth 2 (two) times daily.   HYDROCODONE-ACETAMINOPHEN (NORCO/VICODIN) 5-325 MG TABLET    Take 1 tablet by mouth every 4 (four) hours as needed.     Barrett Henle, PA-C 10/17/16 1610    Charlynne Pander, MD 10/18/16 [phone]

## 2016-10-17 NOTE — ED Notes (Signed)
Pt c/o pain to the anterior right leg onset a couple weeks that is not made better by muscle relaxants.

## 2016-10-17 NOTE — Discharge Instructions (Signed)
Take your medication as prescribed as needed for pain. I recommend discontinuing your prescription of Flexeril but continue taking her prescription of ibuprofen as prescribed. I also recommend applying ice and/or heat to affected area for 15-20 minutes 3-4 times daily. Refrain from doing any heavy lifting, squatting or repetitive movements of exacerbating your symptoms for the next few days. Follow-up with your primary care provider within the next 3-4 days for follow-up evaluation and further management of your back pain. Please return to the Emergency Department if symptoms worsen or new onset of fever, numbness, tingling, groin anesthesia, loss of bowel or bladder, weakness.

## 2016-10-18 ENCOUNTER — Encounter (HOSPITAL_COMMUNITY): Payer: Self-pay

## 2016-10-18 ENCOUNTER — Emergency Department (HOSPITAL_COMMUNITY): Payer: Medicaid Other

## 2016-10-18 ENCOUNTER — Emergency Department (HOSPITAL_COMMUNITY)
Admission: EM | Admit: 2016-10-18 | Discharge: 2016-10-19 | Disposition: A | Discharge status: Home / Self Care | Payer: Medicaid Other | Attending: Emergency Medicine | Admitting: Emergency Medicine

## 2016-10-18 DIAGNOSIS — M48061 Spinal stenosis, lumbar region without neurogenic claudication: Secondary | ICD-10-CM

## 2016-10-18 DIAGNOSIS — M545 Low back pain, unspecified: Secondary | ICD-10-CM

## 2016-10-18 DIAGNOSIS — J45909 Unspecified asthma, uncomplicated: Secondary | ICD-10-CM | POA: Diagnosis not present

## 2016-10-18 DIAGNOSIS — Z79899 Other long term (current) drug therapy: Secondary | ICD-10-CM | POA: Insufficient documentation

## 2016-10-18 DIAGNOSIS — M549 Dorsalgia, unspecified: Secondary | ICD-10-CM | POA: Diagnosis present

## 2016-10-18 LAB — I-STAT CHEM 8, ED
BUN: 17 mg/dL (ref 6–20)
CALCIUM ION: 1.14 mmol/L — AB (ref 1.15–1.40)
CHLORIDE: 103 mmol/L (ref 101–111)
CREATININE: 1.1 mg/dL (ref 0.61–1.24)
GLUCOSE: 86 mg/dL (ref 65–99)
HCT: 44 % (ref 39.0–52.0)
Hemoglobin: 15 g/dL (ref 13.0–17.0)
Potassium: 3.2 mmol/L — ABNORMAL LOW (ref 3.5–5.1)
Sodium: 142 mmol/L (ref 135–145)
TCO2: 27 mmol/L (ref 0–100)

## 2016-10-18 LAB — CBC
HCT: 43.3 % (ref 39.0–52.0)
Hemoglobin: 14.4 g/dL (ref 13.0–17.0)
MCH: 30.6 pg (ref 26.0–34.0)
MCHC: 33.3 g/dL (ref 30.0–36.0)
MCV: 91.9 fL (ref 78.0–100.0)
PLATELETS: 246 10*3/uL (ref 150–400)
RBC: 4.71 MIL/uL (ref 4.22–5.81)
RDW: 13 % (ref 11.5–15.5)
WBC: 7.7 10*3/uL (ref 4.0–10.5)

## 2016-10-18 MED ORDER — POTASSIUM CHLORIDE CRYS ER 20 MEQ PO TBCR
40.0000 meq | EXTENDED_RELEASE_TABLET | Freq: Once | ORAL | Status: AC
  Administered 2016-10-19: 40 meq via ORAL
  Filled 2016-10-18: qty 2

## 2016-10-18 MED ORDER — ONDANSETRON HCL 4 MG/2ML IJ SOLN
4.0000 mg | Freq: Once | INTRAMUSCULAR | Status: AC
  Administered 2016-10-18: 4 mg via INTRAVENOUS
  Filled 2016-10-18: qty 2

## 2016-10-18 MED ORDER — PREDNISONE 20 MG PO TABS
60.0000 mg | ORAL_TABLET | Freq: Once | ORAL | Status: AC
  Administered 2016-10-18: 60 mg via ORAL
  Filled 2016-10-18: qty 3

## 2016-10-18 MED ORDER — KETOROLAC TROMETHAMINE 15 MG/ML IJ SOLN
15.0000 mg | Freq: Once | INTRAMUSCULAR | Status: AC
  Administered 2016-10-18: 15 mg via INTRAVENOUS
  Filled 2016-10-18: qty 1

## 2016-10-18 MED ORDER — HYDROMORPHONE HCL 1 MG/ML IJ SOLN
1.0000 mg | Freq: Once | INTRAMUSCULAR | Status: AC
  Administered 2016-10-18: 1 mg via INTRAVENOUS
  Filled 2016-10-18: qty 1

## 2016-10-18 NOTE — ED Provider Notes (Signed)
[facility]-EMERGENCY DEPT Provider Note   CSN: [id number] Arrival date & time: 10/18/16  1404     History   Chief Complaint Chief Complaint  Patient presents with  . Leg Pain    HPI Jacob Church is a 41 y.o. male.  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 Patient was seen in the ER yesterday for worsening back pain. He states that since then, he has had severe, 10 out of 10, aching, throbbing back pain. He is begun have radiating burning pain and numbness down his bilateral legs. He also endorses decreased sensation along his perineal area. He denies any constipation. No urinary incontinence. No fevers or chills. No history of IV drug use. Due to his severe pain, he presents for further evaluation.  Past Medical History:  Diagnosis Date  . Asthma     There are no active problems to display for this patient.   Past Surgical History:  Procedure Laterality Date  . BACK SURGERY    . KNEE SURGERY         Home Medications    Prior to Admission medications   Medication Sig Start Date End Date Taking? Authorizing Provider  albuterol (PROVENTIL HFA;VENTOLIN HFA) 108 (90 BASE) MCG/ACT inhaler Inhale 2 puffs into the lungs every 6 (six) hours as needed. For shortness of breath    [provider]  cyclobenzaprine (FLEXERIL) 10 MG tablet Take 1 tablet (10 mg total) by mouth 3 (three) times daily as needed for muscle spasms. 10/19/16   Shaune Pollack, MD  diazepam (VALIUM) 5 MG tablet Take 1 tablet (5 mg total) by mouth 2 (two) times daily. 10/17/16   Barrett Henle, PA-C  HYDROcodone-acetaminophen (NORCO/VICODIN) 5-325 MG tablet Take 1 tablet by mouth every 4 (four) hours as needed. 10/17/16   Barrett Henle, PA-C  ibuprofen (ADVIL,MOTRIN) 600 MG tablet Take 1 tablet (600 mg total) by mouth every 6 (six) hours as needed. 10/15/16   Fayrene Helper, PA-C  Multiple Vitamin (MULTIVITAMIN WITH MINERALS) TABS Take 1 tablet by mouth daily.    [provider]  naproxen sodium (ANAPROX DS) 550 MG tablet Take 1 tablet every 12 hours for shoulder pain. Best taken with a meal. Patient not taking: Reported on 10/15/2016 03/21/15   Molpus, Jonny Ruiz, MD  oxyCODONE-acetaminophen (PERCOCET/ROXICET) 5-325 MG tablet Take 1-2 tablets by mouth every 4 (four) hours as needed for severe pain. 10/19/16   Shaune Pollack, MD  predniSONE (DELTASONE) 20 MG tablet Take 3 tablets (60 mg) by mouth daily for 3 days, then 2 tablets (40 mg) by mouth daily for 3 days, then 1 tablet (20 mg by mouth daily) for 3 days, then stop. 10/19/16   Shaune Pollack, MD    Family History History reviewed. No pertinent family history.  Social History Social History  Substance Use Topics  . Smoking status: Never Smoker  . Smokeless tobacco: Never Used  . Alcohol use Yes     Comment: occ     Allergies   Shellfish allergy and Morphine and related   Review of Systems Review of Systems  Musculoskeletal: Positive for arthralgias and back pain.  Neurological: Positive for numbness.  All other systems reviewed and are negative.    Physical Exam Updated Vital Signs BP 125/76   Pulse 67   Temp 98.2 F (36.8 C) (Oral)   Resp 20   SpO2 98%  Physical Exam  Constitutional: He is oriented to person, place, and time. He appears well-developed and well-nourished. No distress.  HENT:  Head: Normocephalic and atraumatic.  Eyes: Conjunctivae are normal.  Neck: Neck supple.  Cardiovascular: Normal rate, regular rhythm and normal heart sounds.   Pulmonary/Chest: Effort normal. No respiratory distress. He has no wheezes.  Abdominal: He exhibits no distension.  Musculoskeletal: He exhibits no edema.  Neurological: He is alert and oriented to person, place, and time. He exhibits normal muscle tone.  Skin: Skin is warm. Capillary refill takes less than 2 seconds. No  rash noted.  Nursing note and vitals reviewed.    Spine Exam: Inspection/Palpation: Exquisite TTP over lower lumbar spine with significant R>L paraspinal TTP. No bruising. No step-offs. No erythema or skin changes. Strength: 5/5 throughout LE bilaterally (hip flexion/extension, adduction/abduction; knee flexion/extension; foot dorsiflexion/plantarflexion, inversion/eversion; great toe inversion) Sensation: Decreased to light touch throughout nearly entire LLE, mild decreased along medial thigh of RLE Reflexes: 1+ quad bilaterally Rectal: Decreased sensation right gluteal and medial thigh but intact sensation around mid buttocks and perianal area    ED Treatments / Results  Labs (all labs ordered are listed, but only abnormal results are displayed) Labs Reviewed  I-STAT CHEM 8, ED - Abnormal; Notable for the following:       Result Value   Potassium 3.2 (*)    Calcium, Ion 1.14 (*)    All other components within normal limits  CBC    EKG  EKG Interpretation None       Radiology Mr Lumbar Spine Wo Contrast  Result Date: 10/19/2016 CLINICAL DATA:  41 y/o M; lower back pain radiating down the right leg, numbness in the left leg, numbness in the testicles, history of back surgery. EXAM: MRI LUMBAR SPINE WITHOUT CONTRAST TECHNIQUE: Multiplanar, multisequence MR imaging of the lumbar spine was performed. No intravenous contrast was administered. COMPARISON:  10/17/2016 lumbar radiographs. 10/15/2016 CT abdomen and pelvis. 01/22/2010 MRI of the lumbar spine. FINDINGS: Segmentation:  Standard. Alignment:  Straightening of lumbar lordosis.  No listhesis. Vertebrae: Bilateral laminectomy changes. No evidence for fracture, diskitis, or suspicious bone lesion. Conus medullaris: Extends to the L1 level and appears normal. Paraspinal and other soft tissues: Negative. Disc levels: L1-2: New 6 mm left foraminal and extraforaminal disc protrusion likely impinging on the exiting left L1 nerve root.  No significant canal stenosis or right foraminal narrowing. L2-3: New right foraminal and extraforaminal disc 8 mm disc protrusion with 15 mm base. The protrusion likely impinges on the exiting right-sided L2 nerve root. No significant canal stenosis or left foraminal narrowing. L3-4: New right-sided extraforaminal disc protrusion measuring 9 mm, probably impinging on the exiting right L3 nerve root. Small diffuse disc bulge with mild bilateral facet hypertrophy. No significant foraminal narrowing or canal stenosis. L4-5: Progression in disc bulge, 4 mm focal left subarticular disc protrusion, and moderate bilateral facet hypertrophy. Mild right and moderate left foraminal narrowing. Left lateral recess effacement with contact upon the descending left L5 nerve root by the disc protrusion. Mild canal stenosis. L5-S1: Progression in a disc disease with a small disc bulge and endplate marginal osteophytes. Moderate bilateral facet hypertrophy. Moderate to severe bilateral foraminal narrowing. No significant canal stenosis. IMPRESSION: 1. Interval progression of lumbar spondylosis from 2011 with multiple new disc protrusions. 2. New left L1-2 foraminal/extraforaminal disc protrusion with probable impingement on the exiting left L1 nerve root. 3. New right L2-3 foraminal/extraforaminal disc protrusion with probable impingement on the exiting right L2 nerve  root. 4. New right L3-4 extraforaminal disc protrusion with probable impingement on the exiting right L3 nerve root. 5. New left L4-5 subarticular small disc protrusion with contact on descending left L5 nerve root in the left lateral recess. 6. Mild L4-5 canal stenosis. 7. Moderate to severe bilateral L5-S1 foraminal narrowing. Electronically Signed   By: Mitzi HansenLance  Furusawa-Stratton M.D.   On: 10/19/2016 00:31    Procedures Procedures (including critical care time)  Medications Ordered in ED Medications  HYDROmorphone (DILAUDID) injection 1 mg (1 mg Intravenous  Given 10/18/16 2206)  ondansetron (ZOFRAN) injection 4 mg (4 mg Intravenous Given 10/18/16 2207)  predniSONE (DELTASONE) tablet 60 mg (60 mg Oral Given 10/18/16 2214)  ketorolac (TORADOL) 15 MG/ML injection 15 mg (15 mg Intravenous Given 10/18/16 2207)  potassium chloride SA (K-DUR,KLOR-CON) CR tablet 40 mEq (40 mEq Oral Given 10/19/16 0103)  oxyCODONE-acetaminophen (PERCOCET/ROXICET) 5-325 MG per tablet 2 tablet (2 tablets Oral Given 10/19/16 0104)     Initial Impression / Assessment and Plan / ED Course  I have reviewed the triage vital signs and the nursing notes.  Pertinent labs & imaging results that were available during my care of the patient were reviewed by me and considered in my medical decision making (see chart for details).    41 yo M with PMHx as above including three prior lumbar spine surgery here with worsening back pain. No trauma. No fever, chills, no h/o IVDU, normal WBC - no signs of oste, epidural abscess, or infectious process. He does have some gluteal numbness likely 2/2 lumbar radiculopathy but no perianal numbness, normal BM and UOP, no urinary retention, or signs of cauda equina. However, given worsening pain and known underlying dz, will check MRI for further evaluation.  MRI shows diffuse foraminal stenosis. No central canal stenosis. No cord edema or signs of cauda equina. I discussed MRI, pt's sx including perineal numbness with  Dr. Dutch QuintPoole - no indication for emergent eval/surgery. Will refer to clinic, start steroids/muscle relaxants/strong analgesia, and d/c home. Return precautions given including any perianal numbness, urinary/bowel incontinence or retention, or other concerning sx.  Final Clinical Impressions(s) / ED Diagnoses   Final diagnoses:  Low back pain  Foraminal stenosis of lumbar region    New Prescriptions Discharge Medication List as of 10/19/2016  1:05 AM    START taking these medications   Details  oxyCODONE-acetaminophen (PERCOCET/ROXICET)  5-325 MG tablet Take 1-2 tablets by mouth every 4 (four) hours as needed for severe pain., Starting Tue 10/19/2016, Print    predniSONE (DELTASONE) 20 MG tablet Take 3 tablets (60 mg) by mouth daily for 3 days, then 2 tablets (40 mg) by mouth daily for 3 days, then 1 tablet (20 mg by mouth daily) for 3 days, then stop., Print         Shaune PollackIsaacs, Mahoganie Basher, MD 10/20/16 1545

## 2016-10-18 NOTE — ED Notes (Signed)
Patient transported to MRI 

## 2016-10-18 NOTE — ED Triage Notes (Signed)
Pt states he has pain down his right leg as well as numbness in the left leg. He reports hx of 3 back surgeries. Pt also states his testicles have been numb. Pt ambulatory with a walker. Pt denies any long travel.

## 2016-10-19 MED ORDER — OXYCODONE-ACETAMINOPHEN 5-325 MG PO TABS: 1.0000 | ORAL_TABLET | ORAL | 0 refills | Status: DC | PRN

## 2016-10-19 MED ORDER — CYCLOBENZAPRINE HCL 10 MG PO TABS: 10.0000 mg | ORAL_TABLET | Freq: Three times a day (TID) | ORAL | 0 refills | Status: DC | PRN

## 2016-10-19 MED ORDER — PREDNISONE 20 MG PO TABS: ORAL_TABLET | ORAL | 0 refills | Status: DC

## 2016-10-19 MED ORDER — OXYCODONE-ACETAMINOPHEN 5-325 MG PO TABS
2.0000 | ORAL_TABLET | Freq: Once | ORAL | Status: AC
  Administered 2016-10-19: 2 via ORAL
  Filled 2016-10-19: qty 2

## 2016-10-19 MED FILL — OXYCODONE-ACETAMINOPHEN 5-3: 5-325 | 2 days supply | Qty: 20 | Fill #0

## 2016-10-19 MED FILL — CYCLOBENZAPRINE 10 MG TABLE: 10 | 7 days supply | Qty: 20 | Fill #0

## 2016-10-19 MED FILL — predniSONE 20 MG TABS: 20 | 9 days supply | Qty: 18 | Fill #0

## 2016-10-19 NOTE — ED Notes (Addendum)
Pt ambulatory with walker accompanied by this nurse. Pt in visible pain but reports he has experienced mild relief after pain medication. Pt reports he feels weak and is in pain while walking, pt noted to be dragging his right foot slightly, however pt able to ambulate independently. Tresa EndoKelly, PA aware. Initial dispo of d/c pt to home will be followed. Pt to f/u with Dr. Jordan LikesPool this week.

## 2016-10-19 NOTE — ED Provider Notes (Signed)
1:50 AM Called to patient's room by nurse after he expressed concern about persistent pain and paresthesias in his right lower extremity. He noted to nurse paresthesias in his right groin. Ambulation attempted. He reports subjective weakness in his right lower extremity with ambulation, but is able to ambulate with a walker unassisted.  I have consulted with Dr. Erma HeritageIsaacs regarding the conversation with Dr. Dutch QuintPoole. Dr. Dutch QuintPoole of neurosurgery is aware of patient's groin paresthesias. MRI has been reviewed which shows no central process. There is impingement on peripheral nerve as a result of disc herniation. Per Dr. Erma HeritageIsaacs, Dr. Dutch QuintPoole expressed reassurance with these findings and believes outpatient follow-up is appropriate. I have continued to encourage steroid use and have given strict instructions to refrain from lifting. Patient verbalizes understanding. Patient discharged in stable condition.   Antony MaduraHumes, Denette Hass, PA-C 10/19/16 0155    Shaune PollackIsaacs, [place], MD 10/20/16 [phone]

## 2016-10-19 NOTE — ED Notes (Addendum)
Per Dr. Erma HeritageIsaacs, will assess pt pain and ambulate pt shortly before d/c pt home.

## 2016-10-19 NOTE — ED Notes (Signed)
Pt reports numbness/tingling in his R groin and R leg. Reports difficulty moving his last three toes. Pt pedal pulses 2+, mobility intact, cap refill <3 seconds. Tresa EndoKelly, [place] aware, will consult Dr. Erma HeritageIsaacs and speak with pt.

## 2016-10-21 ENCOUNTER — Other Ambulatory Visit: Payer: Self-pay | Admitting: Neurosurgery

## 2016-10-21 MED FILL — HYDROCODON-APAP 5-325: 5-325 | 5 days supply | Qty: 60 | Fill #0

## 2016-11-02 ENCOUNTER — Encounter (HOSPITAL_COMMUNITY)
Admission: RE | Admit: 2016-11-02 | Discharge: 2016-11-02 | Disposition: A | Discharge status: Home / Self Care | Payer: Medicaid Other | Source: Ambulatory Visit | Attending: Neurosurgery | Admitting: Neurosurgery

## 2016-11-02 ENCOUNTER — Encounter (HOSPITAL_COMMUNITY): Payer: Self-pay

## 2016-11-02 DIAGNOSIS — Z01812 Encounter for preprocedural laboratory examination: Secondary | ICD-10-CM | POA: Insufficient documentation

## 2016-11-02 HISTORY — DX: Pneumonia, unspecified organism: J18.9

## 2016-11-02 HISTORY — DX: Gastro-esophageal reflux disease without esophagitis: K21.9

## 2016-11-02 LAB — CBC WITH DIFFERENTIAL/PLATELET
BASOS ABS: 0 10*3/uL (ref 0.0–0.1)
Basophils Relative: 0 %
EOS PCT: 0 %
Eosinophils Absolute: 0 10*3/uL (ref 0.0–0.7)
HCT: 51.3 % (ref 39.0–52.0)
Hemoglobin: 16.9 g/dL (ref 13.0–17.0)
LYMPHS ABS: 1.1 10*3/uL (ref 0.7–4.0)
LYMPHS PCT: 12 %
MCH: 30.3 pg (ref 26.0–34.0)
MCHC: 32.9 g/dL (ref 30.0–36.0)
MCV: 91.9 fL (ref 78.0–100.0)
MONO ABS: 0.3 10*3/uL (ref 0.1–1.0)
Monocytes Relative: 3 %
Neutro Abs: 7.5 10*3/uL (ref 1.7–7.7)
Neutrophils Relative %: 85 %
PLATELETS: 276 10*3/uL (ref 150–400)
RBC: 5.58 MIL/uL (ref 4.22–5.81)
RDW: 13.3 % (ref 11.5–15.5)
WBC: 8.9 10*3/uL (ref 4.0–10.5)

## 2016-11-02 LAB — BASIC METABOLIC PANEL
Anion gap: 9 (ref 5–15)
BUN: 16 mg/dL (ref 6–20)
CO2: 26 mmol/L (ref 22–32)
Calcium: 9.7 mg/dL (ref 8.9–10.3)
Chloride: 104 mmol/L (ref 101–111)
Creatinine, Ser: 1.38 mg/dL — ABNORMAL HIGH (ref 0.61–1.24)
GFR calc Af Amer: >60 mL/min (ref >60)
GLUCOSE: 118 mg/dL — AB (ref 65–99)
POTASSIUM: 4.7 mmol/L (ref 3.5–5.1)
Sodium: 139 mmol/L (ref 135–145)

## 2016-11-02 LAB — SURGICAL PCR SCREEN
MRSA, PCR: NEGATIVE
Staphylococcus aureus: POSITIVE — AB

## 2016-11-02 NOTE — Pre-Procedure Instructions (Addendum)
Arrie SenateBrian J Artist  11/02/2016      [facility] - [place], [place] - [address] [place] [address] Phone: [phone] Fax: [phone]    Your procedure is scheduled on July 20.  [facility] at 1030 A.M.  Call this number if you have problems the morning of surgery:  [phone]   Remember:  Do not eat food or drink liquids after midnight.  Take these medicines the morning of surgery with A SIP OF WATER Albuterol inhaler if needed- bring your inhalers with you on the day of surgery, Cyclobenzaprine (Flexeril) if needed, Diazepam (Valium) if needed, Hydrocodone (Norco)  Or Oxycodone (Percocet) if needed  STOP all herbel meds, nsaids (aleve,naproxen,advil,ibuprofen) 7 days prior to surgery starting 11/06/16 including all vitamins/supplements,aspirin   Do not wear jewelry, make-up or nail polish.  Do not wear lotions, powders, or perfumes, or deoderant.  Do not shave 48 hours prior to surgery.  Men may shave face and neck.  Do not bring valuables to the hospital.  [facility] not [facility] any belongings or valuables.  Contacts, dentures or bridgework may not be worn into surgery.  Leave your suitcase in the car.  After surgery it may be brought to your room.  For patients admitted to the hospital, discharge time will be determined by your treatment team.  Patients discharged the day of surgery will not be allowed to drive home.    Special instructions:  [place] - Preparing for Surgery  Before surgery, you can play an important role.  Because skin is not sterile, your skin needs to be as free of germs as possible.  You can reduce the number of germs on you skin by washing with CHG (chlorahexidine gluconate) soap before surgery.  CHG is an antiseptic cleaner which kills germs and bonds with the skin to continue killing germs even after washing.  Please DO NOT use if you have an allergy  to CHG or antibacterial soaps.  If your skin becomes reddened/irritated stop using the CHG and inform your nurse when you arrive at Short Stay.  Do not shave (including legs and underarms) for at least 48 hours prior to the first CHG shower.  You may shave your face.  Please follow these instructions carefully:   1.  Shower with CHG Soap the night before surgery and the                                morning of Surgery.  2.  If you choose to wash your hair, wash your hair first as usual with your       normal shampoo.  3.  After you shampoo, rinse your hair and body thoroughly to remove the                      Shampoo.  4.  Use CHG as you would any other liquid soap.  You can apply chg directly       to the skin and wash gently with scrungie or a clean washcloth.  5.  Apply the CHG Soap to your body ONLY FROM THE NECK DOWN.        Do not use on open wounds or open sores.  Avoid contact with your eyes,       ears, mouth and genitals (private parts).  Wash  genitals (private parts)       with your normal soap.  6.  Wash thoroughly, paying special attention to the area where your surgery        will be performed.  7.  Thoroughly rinse your body with warm water from the neck down.  8.  DO NOT shower/wash with your normal soap after using and rinsing off       the CHG Soap.  9.  Pat yourself dry with a clean towel.            10.  Wear clean pajamas.            11.  Place clean sheets on your bed the night of your first shower and do not        sleep with pets.  Day of Surgery  Do not apply any lotions/deoderants the morning of surgery.  Please wear clean clothes to the hospital/surgery center.     Please read over the  fact sheets that you were given.

## 2016-11-02 NOTE — Pre-Procedure Instructions (Signed)
Arrie SenateBrian J Punches  11/02/2016      [facility] [place], [place] - [address] [place] [address] Phone: [phone] Fax: [phone]    Your procedure is scheduled on July 20.  [facility] at [facility]30 A.M.  Call this number if you have problems the morning of surgery:  [phone]   Remember:  Do not eat food or drink liquids after midnight.  Take these medicines the morning of surgery with A SIP OF WATER Albuterol inhaler if needed- bring your inhalers with you on the day of surgery, Cyclobenzaprine (Flexeril) if needed, Diazepam (Valium) if needed, Hydrocodone (Norco)  Or Oxycodone (Percocet) if needed   Do not wear jewelry, make-up or nail polish.  Do not wear lotions, powders, or perfumes, or deoderant.  Do not shave 48 hours prior to surgery.  Men may shave face and neck.  Do not bring valuables to the hospital.  [facility] is not responsible for any belongings or valuables.  Contacts, dentures or bridgework may not be worn into surgery.  Leave your suitcase in the car.  After surgery it may be brought to your room.  For patients admitted to the hospital, discharge time will be determined by your treatment team.  Patients discharged the day of surgery will not be allowed to drive home.    Special instructions:  [place] - Preparing for Surgery  Before surgery, you can play an important role.  Because skin is not sterile, your skin needs to be as free of germs as possible.  You can reduce the number of germs on you skin by washing with CHG (chlorahexidine gluconate) soap before surgery.  CHG is an antiseptic cleaner which kills germs and bonds with the skin to continue killing germs even after washing.  Please DO NOT use if you have an allergy to CHG or antibacterial soaps.  If your skin becomes reddened/irritated stop using the CHG and inform your nurse when you arrive at Short  Stay.  Do not shave (including legs and underarms) for at least 48 hours prior to the first CHG shower.  You may shave your face.  Please follow these instructions carefully:   1.  Shower with CHG Soap the night before surgery and the                                morning of Surgery.  2.  If you choose to wash your hair, wash your hair first as usual with your       normal shampoo.  3.  After you shampoo, rinse your hair and body thoroughly to remove the                      Shampoo.  4.  Use CHG as you would any other liquid soap.  You can apply chg directly       to the skin and wash gently with scrungie or a clean washcloth.  5.  Apply the CHG Soap to your body ONLY FROM THE NECK DOWN.        Do not use on open wounds or open sores.  Avoid contact with your eyes,       ears, mouth and genitals (private parts).  Wash genitals (private parts)       with your normal soap.  6.  Wash  thoroughly, paying special attention to the area where your surgery        will be performed.  7.  Thoroughly rinse your body with warm water from the neck down.  8.  DO NOT shower/wash with your normal soap after using and rinsing off       the CHG Soap.  9.  Pat yourself dry with a clean towel.            10.  Wear clean pajamas.            11.  Place clean sheets on your bed the night of your first shower and do not        sleep with pets.  Day of Surgery  Do not apply any lotions/deoderants the morning of surgery.  Please wear clean clothes to the hospital/surgery center.     Please read over the following fact sheets that you were given. Pain Booklet, Coughing and Deep Breathing, MRSA Information and Surgical Site Infection Prevention

## 2016-11-03 ENCOUNTER — Inpatient Hospital Stay (HOSPITAL_COMMUNITY): Admission: RE | Admit: 2016-11-03 | Payer: No Typology Code available for payment source | Source: Ambulatory Visit

## 2016-11-11 MED ORDER — CEFAZOLIN SODIUM-DEXTROSE 2-4 GM/100ML-% IV SOLN
2.0000 g | INTRAVENOUS | Status: AC
  Administered 2016-11-12: 2 g via INTRAVENOUS
  Filled 2016-11-11: qty 100

## 2016-11-11 MED ORDER — DEXAMETHASONE SODIUM PHOSPHATE 10 MG/ML IJ SOLN
10.0000 mg | INTRAMUSCULAR | Status: AC
  Administered 2016-11-12: 10 mg via INTRAVENOUS
  Filled 2016-11-11: qty 1

## 2016-11-12 ENCOUNTER — Encounter (HOSPITAL_COMMUNITY): Payer: Self-pay

## 2016-11-12 ENCOUNTER — Ambulatory Visit (HOSPITAL_COMMUNITY): Payer: Medicaid Other | Admitting: Anesthesiology

## 2016-11-12 ENCOUNTER — Observation Stay (HOSPITAL_COMMUNITY): Payer: Medicaid Other

## 2016-11-12 ENCOUNTER — Encounter (HOSPITAL_COMMUNITY)
Admission: RE | Disposition: A | Discharge status: Home / Self Care | Payer: Self-pay | Source: Ambulatory Visit | Attending: Neurosurgery

## 2016-11-12 ENCOUNTER — Observation Stay (HOSPITAL_COMMUNITY)
Admission: RE | Admit: 2016-11-12 | Discharge: 2016-11-12 | Disposition: A | Discharge status: Home / Self Care | Payer: Medicaid Other | Source: Ambulatory Visit | Attending: Neurosurgery | Admitting: Neurosurgery

## 2016-11-12 DIAGNOSIS — M5116 Intervertebral disc disorders with radiculopathy, lumbar region: Principal | ICD-10-CM | POA: Insufficient documentation

## 2016-11-12 DIAGNOSIS — Z885 Allergy status to narcotic agent status: Secondary | ICD-10-CM | POA: Diagnosis not present

## 2016-11-12 DIAGNOSIS — J45909 Unspecified asthma, uncomplicated: Secondary | ICD-10-CM | POA: Insufficient documentation

## 2016-11-12 DIAGNOSIS — M5126 Other intervertebral disc displacement, lumbar region: Secondary | ICD-10-CM | POA: Diagnosis present

## 2016-11-12 DIAGNOSIS — Z79899 Other long term (current) drug therapy: Secondary | ICD-10-CM | POA: Insufficient documentation

## 2016-11-12 DIAGNOSIS — Z91013 Allergy to seafood: Secondary | ICD-10-CM | POA: Insufficient documentation

## 2016-11-12 DIAGNOSIS — Z419 Encounter for procedure for purposes other than remedying health state, unspecified: Secondary | ICD-10-CM

## 2016-11-12 DIAGNOSIS — K219 Gastro-esophageal reflux disease without esophagitis: Secondary | ICD-10-CM | POA: Diagnosis not present

## 2016-11-12 HISTORY — PX: LUMBAR LAMINECTOMY/DECOMPRESSION MICRODISCECTOMY: SHX5026

## 2016-11-12 SURGERY — LUMBAR LAMINECTOMY/DECOMPRESSION MICRODISCECTOMY 1 LEVEL
Anesthesia: General | Site: Back | Laterality: Right

## 2016-11-12 MED ORDER — MENTHOL 3 MG MT LOZG: 1.0000 | LOZENGE | OROMUCOSAL | Status: DC | PRN

## 2016-11-12 MED ORDER — ALBUTEROL SULFATE (2.5 MG/3ML) 0.083% IN NEBU: 2.5000 mg | INHALATION_SOLUTION | Freq: Four times a day (QID) | RESPIRATORY_TRACT | Status: DC | PRN

## 2016-11-12 MED ORDER — PHENOL 1.4 % MT LIQD: 1.0000 | OROMUCOSAL | Status: DC | PRN

## 2016-11-12 MED ORDER — CYCLOBENZAPRINE HCL 10 MG PO TABS
10.0000 mg | ORAL_TABLET | Freq: Three times a day (TID) | ORAL | Status: DC | PRN
  Administered 2016-11-12: 10 mg via ORAL
  Filled 2016-11-12: qty 1

## 2016-11-12 MED ORDER — BUPIVACAINE HCL (PF) 0.25 % IJ SOLN
INTRAMUSCULAR | Status: DC | PRN
  Administered 2016-11-12: 20 mL

## 2016-11-12 MED ORDER — ADULT MULTIVITAMIN W/MINERALS CH: 1.0000 | ORAL_TABLET | Freq: Every day | ORAL | Status: DC

## 2016-11-12 MED ORDER — MIDAZOLAM HCL 2 MG/2ML IJ SOLN
INTRAMUSCULAR | Status: AC
  Filled 2016-11-12: qty 2

## 2016-11-12 MED ORDER — SODIUM CHLORIDE 0.9 % IV SOLN: 250.0000 mL | INTRAVENOUS | Status: DC

## 2016-11-12 MED ORDER — ONDANSETRON HCL 4 MG/2ML IJ SOLN
INTRAMUSCULAR | Status: AC
  Filled 2016-11-12: qty 2

## 2016-11-12 MED ORDER — LACTATED RINGERS IV SOLN
INTRAVENOUS | Status: DC
  Administered 2016-11-12 (×2): via INTRAVENOUS

## 2016-11-12 MED ORDER — KETOROLAC TROMETHAMINE 30 MG/ML IJ SOLN
INTRAMUSCULAR | Status: DC | PRN
  Administered 2016-11-12: 30 mg via INTRAVENOUS

## 2016-11-12 MED ORDER — SUGAMMADEX SODIUM 200 MG/2ML IV SOLN
INTRAVENOUS | Status: AC
  Filled 2016-11-12: qty 2

## 2016-11-12 MED ORDER — FAMOTIDINE 20 MG PO TABS: 20.0000 mg | ORAL_TABLET | Freq: Two times a day (BID) | ORAL | Status: DC

## 2016-11-12 MED ORDER — MUPIROCIN 2 % EX OINT
TOPICAL_OINTMENT | CUTANEOUS | Status: AC
  Filled 2016-11-12: qty 22

## 2016-11-12 MED ORDER — SODIUM CHLORIDE 0.9% FLUSH: 3.0000 mL | INTRAVENOUS | Status: DC | PRN

## 2016-11-12 MED ORDER — THROMBIN 5000 UNITS EX SOLR
CUTANEOUS | Status: DC | PRN
  Administered 2016-11-12 (×2): 5000 [IU] via TOPICAL

## 2016-11-12 MED ORDER — PROPOFOL 10 MG/ML IV BOLUS
INTRAVENOUS | Status: DC | PRN
  Administered 2016-11-12: 140 mg via INTRAVENOUS

## 2016-11-12 MED ORDER — FENTANYL CITRATE (PF) 100 MCG/2ML IJ SOLN
25.0000 ug | INTRAMUSCULAR | Status: DC | PRN
  Administered 2016-11-12 (×2): 50 ug via INTRAVENOUS

## 2016-11-12 MED ORDER — SUGAMMADEX SODIUM 200 MG/2ML IV SOLN
INTRAVENOUS | Status: DC | PRN
  Administered 2016-11-12: 155 mg via INTRAVENOUS

## 2016-11-12 MED ORDER — HYDROCODONE-ACETAMINOPHEN 5-325 MG PO TABS
1.0000 | ORAL_TABLET | ORAL | Status: DC | PRN
  Administered 2016-11-12: 2 via ORAL

## 2016-11-12 MED ORDER — KETOROLAC TROMETHAMINE 15 MG/ML IJ SOLN
30.0000 mg | Freq: Four times a day (QID) | INTRAMUSCULAR | Status: DC
  Administered 2016-11-12: 30 mg via INTRAVENOUS
  Filled 2016-11-12: qty 2

## 2016-11-12 MED ORDER — ONDANSETRON HCL 4 MG/2ML IJ SOLN
INTRAMUSCULAR | Status: DC | PRN
  Administered 2016-11-12: 4 mg via INTRAVENOUS

## 2016-11-12 MED ORDER — FENTANYL CITRATE (PF) 250 MCG/5ML IJ SOLN
INTRAMUSCULAR | Status: AC
  Filled 2016-11-12: qty 5

## 2016-11-12 MED ORDER — THROMBIN 5000 UNITS EX SOLR
CUTANEOUS | Status: AC
  Filled 2016-11-12: qty 10000

## 2016-11-12 MED ORDER — MUPIROCIN 2 % EX OINT
1.0000 "application " | TOPICAL_OINTMENT | Freq: Two times a day (BID) | CUTANEOUS | Status: DC
  Administered 2016-11-12: 1 via TOPICAL

## 2016-11-12 MED ORDER — HEMOSTATIC AGENTS (NO CHARGE) OPTIME
TOPICAL | Status: DC | PRN
  Administered 2016-11-12: 1 via TOPICAL

## 2016-11-12 MED ORDER — PHENYLEPHRINE 40 MCG/ML (10ML) SYRINGE FOR IV PUSH (FOR BLOOD PRESSURE SUPPORT)
PREFILLED_SYRINGE | INTRAVENOUS | Status: AC
  Filled 2016-11-12: qty 10

## 2016-11-12 MED ORDER — LIDOCAINE HCL (CARDIAC) 20 MG/ML IV SOLN
INTRAVENOUS | Status: AC
  Filled 2016-11-12: qty 5

## 2016-11-12 MED ORDER — MIDAZOLAM HCL 5 MG/5ML IJ SOLN
INTRAMUSCULAR | Status: DC | PRN
  Administered 2016-11-12: 2 mg via INTRAVENOUS

## 2016-11-12 MED ORDER — SODIUM CHLORIDE 0.9% FLUSH: 3.0000 mL | Freq: Two times a day (BID) | INTRAVENOUS | Status: DC

## 2016-11-12 MED ORDER — FENTANYL CITRATE (PF) 250 MCG/5ML IJ SOLN
INTRAMUSCULAR | Status: DC | PRN
  Administered 2016-11-12: 100 ug via INTRAVENOUS
  Administered 2016-11-12: 50 ug via INTRAVENOUS

## 2016-11-12 MED ORDER — KETOROLAC TROMETHAMINE 30 MG/ML IJ SOLN
INTRAMUSCULAR | Status: AC
  Filled 2016-11-12: qty 1

## 2016-11-12 MED ORDER — FENTANYL CITRATE (PF) 100 MCG/2ML IJ SOLN
INTRAMUSCULAR | Status: AC
  Filled 2016-11-12: qty 2

## 2016-11-12 MED ORDER — LIDOCAINE HCL (CARDIAC) 20 MG/ML IV SOLN
INTRAVENOUS | Status: DC | PRN
  Administered 2016-11-12: 100 mg via INTRAVENOUS

## 2016-11-12 MED ORDER — ROCURONIUM BROMIDE 100 MG/10ML IV SOLN
INTRAVENOUS | Status: DC | PRN
  Administered 2016-11-12: 50 mg via INTRAVENOUS

## 2016-11-12 MED ORDER — ONDANSETRON HCL 4 MG/2ML IJ SOLN: 4.0000 mg | Freq: Four times a day (QID) | INTRAMUSCULAR | Status: DC | PRN

## 2016-11-12 MED ORDER — 0.9 % SODIUM CHLORIDE (POUR BTL) OPTIME
TOPICAL | Status: DC | PRN
  Administered 2016-11-12: 1000 mL

## 2016-11-12 MED ORDER — HYDROMORPHONE HCL 1 MG/ML IJ SOLN: 0.5000 mg | INTRAMUSCULAR | Status: DC | PRN

## 2016-11-12 MED ORDER — CHLORHEXIDINE GLUCONATE CLOTH 2 % EX PADS: 6.0000 | MEDICATED_PAD | Freq: Once | CUTANEOUS | Status: DC

## 2016-11-12 MED ORDER — BUPIVACAINE HCL (PF) 0.25 % IJ SOLN
INTRAMUSCULAR | Status: AC
  Filled 2016-11-12: qty 30

## 2016-11-12 MED ORDER — CEFAZOLIN SODIUM-DEXTROSE 1-4 GM/50ML-% IV SOLN: 1.0000 g | Freq: Three times a day (TID) | INTRAVENOUS | Status: DC

## 2016-11-12 MED ORDER — ALBUTEROL SULFATE HFA 108 (90 BASE) MCG/ACT IN AERS: 2.0000 | INHALATION_SPRAY | Freq: Four times a day (QID) | RESPIRATORY_TRACT | Status: DC | PRN

## 2016-11-12 MED ORDER — HYDROCODONE-ACETAMINOPHEN 5-325 MG PO TABS
ORAL_TABLET | ORAL | Status: DC
Start: 2016-11-12 — End: 2016-11-12
  Filled 2016-11-12: qty 2

## 2016-11-12 MED ORDER — ONDANSETRON HCL 4 MG PO TABS
4.0000 mg | ORAL_TABLET | Freq: Four times a day (QID) | ORAL | Status: DC | PRN
Start: 2016-11-12 — End: 2016-11-12

## 2016-11-12 MED ORDER — PROPOFOL 10 MG/ML IV BOLUS
INTRAVENOUS | Status: AC
  Filled 2016-11-12: qty 20

## 2016-11-12 MED ORDER — SODIUM CHLORIDE 0.9 % IR SOLN
Status: DC | PRN
  Administered 2016-11-12: 500 mL

## 2016-11-12 SURGICAL SUPPLY — 47 items
BAG DECANTER FOR FLEXI CONT (MISCELLANEOUS) ×3 IMPLANT
BENZOIN TINCTURE PRP APPL 2/3 (GAUZE/BANDAGES/DRESSINGS) ×3 IMPLANT
BLADE CLIPPER SURG (BLADE) IMPLANT
BUR CUTTER 7.0 ROUND (BURR) ×3 IMPLANT
CANISTER SUCT 3000ML PPV (MISCELLANEOUS) ×3 IMPLANT
CARTRIDGE OIL MAESTRO DRILL (MISCELLANEOUS) ×1 IMPLANT
CLOSURE WOUND 1/2 X4 (GAUZE/BANDAGES/DRESSINGS) ×1
DECANTER SPIKE VIAL GLASS SM (MISCELLANEOUS) ×3 IMPLANT
DERMABOND ADVANCED (GAUZE/BANDAGES/DRESSINGS) ×2
DERMABOND ADVANCED .7 DNX12 (GAUZE/BANDAGES/DRESSINGS) ×1 IMPLANT
DIFFUSER DRILL AIR PNEUMATIC (MISCELLANEOUS) ×3 IMPLANT
DRAPE HALF SHEET 40X57 (DRAPES) IMPLANT
DRAPE LAPAROTOMY 100X72X124 (DRAPES) ×3 IMPLANT
DRAPE MICROSCOPE LEICA (MISCELLANEOUS) ×3 IMPLANT
DRAPE POUCH INSTRU U-SHP 10X18 (DRAPES) ×3 IMPLANT
DRAPE SURG 17X23 STRL (DRAPES) ×6 IMPLANT
DRSG OPSITE POSTOP 3X4 (GAUZE/BANDAGES/DRESSINGS) ×3 IMPLANT
DURAPREP 26ML APPLICATOR (WOUND CARE) ×3 IMPLANT
ELECT REM PT RETURN 9FT ADLT (ELECTROSURGICAL) ×3
ELECTRODE REM PT RTRN 9FT ADLT (ELECTROSURGICAL) ×1 IMPLANT
GAUZE SPONGE 4X4 12PLY STRL (GAUZE/BANDAGES/DRESSINGS) ×3 IMPLANT
GAUZE SPONGE 4X4 16PLY XRAY LF (GAUZE/BANDAGES/DRESSINGS) IMPLANT
GLOVE ECLIPSE 9.0 STRL (GLOVE) ×3 IMPLANT
GLOVE EXAM NITRILE LRG STRL (GLOVE) IMPLANT
GLOVE EXAM NITRILE XL STR (GLOVE) IMPLANT
GLOVE EXAM NITRILE XS STR PU (GLOVE) IMPLANT
GOWN STRL REUS W/ TWL LRG LVL3 (GOWN DISPOSABLE) IMPLANT
GOWN STRL REUS W/ TWL XL LVL3 (GOWN DISPOSABLE) ×2 IMPLANT
GOWN STRL REUS W/TWL 2XL LVL3 (GOWN DISPOSABLE) IMPLANT
GOWN STRL REUS W/TWL LRG LVL3 (GOWN DISPOSABLE)
GOWN STRL REUS W/TWL XL LVL3 (GOWN DISPOSABLE) ×4
KIT BASIN OR (CUSTOM PROCEDURE TRAY) ×3 IMPLANT
KIT ROOM TURNOVER OR (KITS) ×3 IMPLANT
NEEDLE HYPO 22GX1.5 SAFETY (NEEDLE) ×3 IMPLANT
NEEDLE SPNL 22GX3.5 QUINCKE BK (NEEDLE) ×3 IMPLANT
NS IRRIG 1000ML POUR BTL (IV SOLUTION) ×3 IMPLANT
OIL CARTRIDGE MAESTRO DRILL (MISCELLANEOUS) ×3
PACK LAMINECTOMY NEURO (CUSTOM PROCEDURE TRAY) ×3 IMPLANT
PAD ARMBOARD 7.5X6 YLW CONV (MISCELLANEOUS) ×9 IMPLANT
RUBBERBAND STERILE (MISCELLANEOUS) ×6 IMPLANT
SPONGE SURGIFOAM ABS GEL SZ50 (HEMOSTASIS) ×3 IMPLANT
STRIP CLOSURE SKIN 1/2X4 (GAUZE/BANDAGES/DRESSINGS) ×2 IMPLANT
SUT VIC AB 2-0 CT1 18 (SUTURE) ×3 IMPLANT
SUT VIC AB 3-0 SH 8-18 (SUTURE) ×3 IMPLANT
TOWEL GREEN STERILE (TOWEL DISPOSABLE) ×3 IMPLANT
TOWEL GREEN STERILE FF (TOWEL DISPOSABLE) ×3 IMPLANT
WATER STERILE IRR 1000ML POUR (IV SOLUTION) ×3 IMPLANT

## 2016-11-12 NOTE — Progress Notes (Signed)
Discharged instructions/education/AVS given to patient and verbalized understanding. Pain is mild, no swelling , no drainage, no redness noted on incision site. Patient ambulating well. Tolerated dinner well. Patient emptying bladder well. Discharged via wheelchair.

## 2016-11-12 NOTE — Transfer of Care (Signed)
Immediate Anesthesia Transfer of Care Note  Patient: Jacob Church  Procedure(s) Performed: Procedure(s): Microdiscectomy - right - Lumbar two-Lumbar three  extraforaminal (Right)  Patient Location: PACU  Anesthesia Type:General  Level of Consciousness: drowsy and patient cooperative  Airway & Oxygen Therapy: Patient Spontanous Breathing and Patient connected to face mask oxygen  Post-op Assessment: Report given to RN and Post -op Vital signs reviewed and stable  Post vital signs: Reviewed and stable  Last Vitals:  Vitals:   11/12/16 1009 11/12/16 1409  BP: 135/90   Pulse: 71   Resp: 20   Temp: 36.6 C 36.5 C    Last Pain:  Vitals:   11/12/16 1409  TempSrc: Oral  PainSc:          Complications: No apparent anesthesia complications

## 2016-11-12 NOTE — Brief Op Note (Signed)
11/12/2016  1:59 PM  PATIENT:  Jacob Church  41 y.o. male  PRE-OPERATIVE DIAGNOSIS:  Herniated Nucleus  Pulposes  POST-OPERATIVE DIAGNOSIS:  * No post-op diagnosis entered *  PROCEDURE:  Procedure(s): Microdiscectomy - right - Lumbar two-Lumbar three  extraforaminal (Right)  SURGEON:  Surgeon(s) and Role:    Julio Sicks* Marcelis Wissner, MD - Primary  PHYSICIAN ASSISTANT:   ASSISTANTS:    ANESTHESIA:   general  EBL:  Total I/O In: -  Out: 20 [Blood:20]  BLOOD ADMINISTERED:none  DRAINS: none   LOCAL MEDICATIONS USED:  MARCAINE     SPECIMEN:  No Specimen  DISPOSITION OF SPECIMEN:  N/A  COUNTS:  YES  TOURNIQUET:  * No tourniquets in log *  DICTATION: .Dragon Dictation  PLAN OF CARE: Admit for overnight observation  PATIENT DISPOSITION:  PACU - hemodynamically stable.   Delay start of Pharmacological VTE agent (>24hrs) due to surgical blood loss or risk of bleeding: yes

## 2016-11-12 NOTE — Anesthesia Procedure Notes (Addendum)
Procedure Name: Intubation Date/Time: 11/12/2016 1:08 PM Performed by: Rush Farmer E Pre-anesthesia Checklist: Patient identified, Emergency Drugs available, Suction available and Patient being monitored Patient Re-evaluated:Patient Re-evaluated prior to induction Oxygen Delivery Method: Circle system utilized Preoxygenation: Pre-oxygenation with 100% oxygen Induction Type: IV induction Ventilation: Mask ventilation without difficulty Laryngoscope Size: Glidescope and 4 Tube type: Oral Tube size: 7.5 mm Number of attempts: 2 Airway Equipment and Method: Video-laryngoscopy Placement Confirmation: ETT inserted through vocal cords under direct vision,  positive ETCO2 and breath sounds checked- equal and bilateral Secured at: 22 cm Tube secured with: Tape Dental Injury: Teeth and Oropharynx as per pre-operative assessment  Difficulty Due To: Difficult Airway- due to anterior larynx Comments: DL x1 with MAC 4 unable to visualize VC per CRNA. AOI using Glidescope per Dr. Ola Spurr. Anterior larynx and limited oral opening.

## 2016-11-12 NOTE — Anesthesia Procedure Notes (Deleted)
Performed by: Nella Botsford DANE       

## 2016-11-12 NOTE — Discharge Summary (Signed)
2Physician Discharge Summary  Patient ID: Jacob Church MRN: [id number] DOB/AGE: July 10, 1975 41 y.o.  Admit date: 11/12/2016 Discharge date: 11/12/2016  Admission Diagnoses:  Discharge Diagnoses:  Active Problems:   HNP (herniated nucleus pulposus), lumbar   Discharged Condition: good  Hospital Course: Patient did hospital where he underwent uncomplicated lumbar microdiscectomy. Postoperative use doing very well. Preoperative back and leg pain resolved. Ambulating without difficulty. Ready for discharge home.  Consults:   Significant Diagnostic Studies:   Treatments:   Discharge Exam: Blood pressure (!) 141/90, pulse 79, temperature 97.6 F (36.4 C), resp. rate 16, weight 77.1 kg (170 lb), SpO2 100 %.  awke and alert.Oriented and appropriate. Cranial nerve function intact. Motor sensory function extremities normal. Wound clean and dry. Chest and abdomen benign. Disposition: 01-Home or Self Care   Allergies as of 11/12/2016      Reactions   Morphine And Related Other (See Comments)   Side effects are extreme, advised by his doctor not to take   Shellfish Allergy Anaphylaxis, Itching      Medication List    TAKE these medications   albuterol 108 (90 Base) MCG/ACT inhaler Commonly known as:  PROVENTIL HFA;VENTOLIN HFA Inhale 2 puffs into the lungs every 6 (six) hours as needed. For shortness of breath   cyclobenzaprine 10 MG tablet Commonly known as:  FLEXERIL Take 1 tablet (10 mg total) by mouth 3 (three) times daily as needed for muscle spasms.   diazepam 5 MG tablet Commonly known as:  VALIUM Take 1 tablet (5 mg total) by mouth 2 (two) times daily. What changed:  when to take this  reasons to take this   HYDROcodone-acetaminophen 5-325 MG tablet Commonly known as:  NORCO/VICODIN Take 1 tablet by mouth every 4 (four) hours as needed. What changed:  reasons to take this   ibuprofen 600 MG tablet Commonly known as:  ADVIL,MOTRIN Take 1 tablet (600 mg total)  by mouth every 6 (six) hours as needed.   multivitamin with minerals Tabs tablet Take 1 tablet by mouth daily.   naproxen 375 MG tablet Commonly known as:  NAPROSYN Take 375 mg by mouth 2 (two) times daily with a meal.   oxyCODONE-acetaminophen 5-325 MG tablet Commonly known as:  PERCOCET/ROXICET Take 1-2 tablets by mouth every 4 (four) hours as needed for severe pain.   predniSONE 20 MG tablet Commonly known as:  DELTASONE Take 3 tablets (60 mg) by mouth daily for 3 days, then 2 tablets (40 mg) by mouth daily for 3 days, then 1 tablet (20 mg by mouth daily) for 3 days, then stop.   ranitidine 75 MG tablet Commonly known as:  ZANTAC Take 75 mg by mouth 2 (two) times daily.        Signed: Erial Fikes A 11/12/2016, 6:50 PM

## 2016-11-12 NOTE — Anesthesia Postprocedure Evaluation (Signed)
Anesthesia Post Note  Patient: Jacob Church  Procedure(s) Performed: Procedure(s) (LRB): Microdiscectomy - right - Lumbar two-Lumbar three  extraforaminal (Right)     Patient location during evaluation: PACU Anesthesia Type: General Level of consciousness: awake and alert Pain management: pain level controlled Vital Signs Assessment: post-procedure vital signs reviewed and stable Respiratory status: spontaneous breathing, nonlabored ventilation and respiratory function stable Cardiovascular status: blood pressure returned to baseline and stable Postop Assessment: no signs of nausea or vomiting Anesthetic complications: no    Last Vitals:  Vitals:   11/12/16 1425 11/12/16 1440  BP: 126/89 123/69  Pulse: 69 79  Resp: 12 13  Temp:      Last Pain:  Vitals:   11/12/16 1450  TempSrc:   PainSc: 2                  Avenir Lozinski,W. EDMOND

## 2016-11-12 NOTE — H&P (Signed)
Jacob SenateBrian J Church is an 41 y.o. male.   Chief Complaint: Right leg pain HPI: 41 year old male status post multiple previous lumbar surgeries by another physician presents with severe right anterior groin and anterior thigh pain. Workup demonstrates evidence of a large right-sided L2-L3 foraminal disc herniation with marked compression of the exiting right L2 nerve root. Patient has failed conservative management and presents now for microdiscectomy in hopes of improving his symptoms.  Past Medical History:  Diagnosis Date  . Asthma   . GERD (gastroesophageal reflux disease)   . Pneumonia hx    Past Surgical History:  Procedure Laterality Date  . BACK SURGERY     numerous  . KNEE SURGERY Bilateral    scope x2 rt. left menicus, acl rt     History reviewed. No pertinent family history. Social History:  reports that he has never smoked. He has never used smokeless tobacco. He reports that he drinks alcohol. He reports that he does not use drugs.  Allergies:  Allergies  Allergen Reactions  . Morphine And Related Other (See Comments)    Side effects are extreme, advised by his doctor not to take  . Shellfish Allergy Anaphylaxis and Itching    Medications Prior to Admission  Medication Sig Dispense Refill  . cyclobenzaprine (FLEXERIL) 10 MG tablet Take 1 tablet (10 mg total) by mouth 3 (three) times daily as needed for muscle spasms. 20 tablet 0  . diazepam (VALIUM) 5 MG tablet Take 1 tablet (5 mg total) by mouth 2 (two) times daily. (Patient taking differently: Take 5 mg by mouth 2 (two) times daily as needed for muscle spasms. ) 10 tablet 0  . HYDROcodone-acetaminophen (NORCO/VICODIN) 5-325 MG tablet Take 1 tablet by mouth every 4 (four) hours as needed. (Patient taking differently: Take 1 tablet by mouth every 4 (four) hours as needed (for pain.). ) 10 tablet 0  . Multiple Vitamin (MULTIVITAMIN WITH MINERALS) TABS Take 1 tablet by mouth daily.    Marland Kitchen. oxyCODONE-acetaminophen  (PERCOCET/ROXICET) 5-325 MG tablet Take 1-2 tablets by mouth every 4 (four) hours as needed for severe pain. 20 tablet 0  . ranitidine (ZANTAC) 75 MG tablet Take 75 mg by mouth 2 (two) times daily.    Marland Kitchen. albuterol (PROVENTIL HFA;VENTOLIN HFA) 108 (90 BASE) MCG/ACT inhaler Inhale 2 puffs into the lungs every 6 (six) hours as needed. For shortness of breath    . ibuprofen (ADVIL,MOTRIN) 600 MG tablet Take 1 tablet (600 mg total) by mouth every 6 (six) hours as needed. (Patient not taking: Reported on 11/01/2016) 30 tablet 0  . naproxen (NAPROSYN) 375 MG tablet Take 375 mg by mouth 2 (two) times daily with a meal.    . predniSONE (DELTASONE) 20 MG tablet Take 3 tablets (60 mg) by mouth daily for 3 days, then 2 tablets (40 mg) by mouth daily for 3 days, then 1 tablet (20 mg by mouth daily) for 3 days, then stop. (Patient not taking: Reported on 11/01/2016) 18 tablet 0    No results found for this or any previous visit (from the past 48 hour(s)). No results found.  Pertinent items noted in HPI and remainder of comprehensive ROS otherwise negative.  Blood pressure 135/90, pulse 71, temperature 97.9 F (36.6 C), temperature source Oral, resp. rate 20, weight 77.1 kg (170 lb), SpO2 96 %.  Patient is awake and alert. He is oriented and appropriate. He is up C uncomfortable. Examination his head ears eyes and thirds unremarkable. Chest and abdomen are benign. Extremities  are free from injury deformity. Neurologically he is awake and alert. Oriented and appropriate. Cranial nerve function intact. Motor examination reveals weakness of his right-sided hip flexors and mild weakness of his right-sided quadriceps muscle. Sensory examination was decreased sensation pinprick and light touch in his right L2 dermatome. Deep (normal active. No evidence of long track signs. Gait antalgic posture is flexed. Assessment/Plan Right L2-L3 extra foraminal herniated mucous pulposis with recall. Plan right L2-3 extra foraminal  microdiscectomy. Risks and benefits of been explained. Patient wishes to proceed.  Jacob Church A 11/12/2016, 12:27 PM

## 2016-11-12 NOTE — Progress Notes (Signed)
Received post Sx, pt alert and oriented, ambulate to the bathroom without assistance. Dressing CDI, no complaints of any pain or discomfort at this time.

## 2016-11-12 NOTE — Discharge Instructions (Signed)

## 2016-11-12 NOTE — Anesthesia Preprocedure Evaluation (Addendum)
Anesthesia Evaluation  Patient identified by MRN, date of birth, ID band Patient awake    Reviewed: Allergy & Precautions, H&P , NPO status , Patient's Chart, lab work & pertinent test results, reviewed documented beta blocker date and time   Airway Mallampati: II  TM Distance: >3 FB Neck ROM: full    Dental no notable dental hx. (+) Teeth Intact, Chipped, Dental Advisory Given,    Pulmonary    Pulmonary exam normal breath sounds clear to auscultation       Cardiovascular Exercise Tolerance: Good  Rhythm:regular Rate:Normal     Neuro/Psych    GI/Hepatic   Endo/Other    Renal/GU      Musculoskeletal   Abdominal   Peds  Hematology   Anesthesia Other Findings       Reproductive/Obstetrics                           Anesthesia Physical Anesthesia Plan  ASA: II  Anesthesia Plan: General   Post-op Pain Management:    Induction: Intravenous  PONV Risk Score and Plan:   Airway Management Planned: Oral ETT  Additional Equipment:   Intra-op Plan:   Post-operative Plan: Extubation in OR  Informed Consent: I have reviewed the patients History and Physical, chart, labs and discussed the procedure including the risks, benefits and alternatives for the proposed anesthesia with the patient or authorized representative who has indicated his/her understanding and acceptance.   Dental Advisory Given  Plan Discussed with: CRNA and Surgeon  Anesthesia Plan Comments: (  )        Anesthesia Quick Evaluation

## 2016-11-12 NOTE — Op Note (Signed)
Date of procedure: 11/12/2016  Date of dictation: Same  Service: Neurosurgery  Preoperative diagnosis: Right L2-L3 extraforaminal herniated mucous pulposis with radiculopathy  Postoperative diagnosis: Same  Procedure Name: Right L2-L3 extraforaminal microdiscectomy  Surgeon:Aeric Burnham A.Romaine Maciolek, M.D.  Asst. Surgeon: None  Anesthesia: General  Indication: 41 year old male with right lower extremity pain cyst with a right L2 radiculopathy failing conservative management. Workup demonstrates evidence of a large right-sided L2-L3 foraminal disc herniation with marked compression the exiting right L2 nerve root. Patient presents now for extra foraminal microdiscectomy in hopes of improving his symptoms.  Operative note: After induction of anesthesia, patient position prone onto Wilson frame appropriate padded. Lumbar region prepped and draped sterilely. Incision made overlying L2-3. Dissection performed on the right. Retractor placed. X-ray taken. Level confirmed. Next foraminal approach performed using high-speed drill and Kerrison rongeurs to remove small amount of the superior lateral aspect of the superior articular process of L3. Intertransverse ligament was elevated and resected. Underlying disc herniation and right L2 nerve root were then applied. Lexapro field these might dissection of the epidural space. Disc herniation was dissected free and removed in several large pieces using pituitary micro-rongeurs. All elements the disc herniation were resected. No evidence of any injury to the L2 nerve root. No evidence of any residual compression. Wound is then irrigated with and bike solution. Gelfoam was placed topically for hemostasis. Wounds and close in layers with Vicryl sutures. Steri-Strips and sterile dressing were applied. No apparent complications. Patient tolerated the procedure well and he returns to the recovery room postop.

## 2016-11-13 ENCOUNTER — Encounter (HOSPITAL_COMMUNITY): Payer: Self-pay | Admitting: Neurosurgery

## 2016-11-16 MED FILL — HYDROCODON-APAP 5-325: 5-325 | 5 days supply | Qty: 60 | Fill #0

## 2017-01-26 MED FILL — HYDROCODON-APAP 5-325: 5-325 | 5 days supply | Qty: 30 | Fill #0

## 2017-02-10 MED FILL — HYDROCODON-APAP 5-325: 5-325 | 5 days supply | Qty: 30 | Fill #0

## 2017-02-21 MED FILL — CHLORHEXIDINE 0.12% RINSE: 0.12 | 15 days supply | Qty: 473 | Fill #0

## 2017-02-21 MED FILL — CLINDAMYCIN HCL 300 MG CAPS: 300 | 7 days supply | Qty: 21 | Fill #0

## 2017-03-02 MED FILL — CLINDAMYCIN HCL 300 MG CAPS: 300 | 7 days supply | Qty: 21 | Fill #1

## 2017-05-01 ENCOUNTER — Emergency Department (HOSPITAL_BASED_OUTPATIENT_CLINIC_OR_DEPARTMENT_OTHER)
Admission: EM | Admit: 2017-05-01 | Discharge: 2017-05-01 | Disposition: A | Discharge status: Home / Self Care | Payer: Medicaid Other | Attending: Emergency Medicine | Admitting: Emergency Medicine

## 2017-05-01 ENCOUNTER — Emergency Department (HOSPITAL_BASED_OUTPATIENT_CLINIC_OR_DEPARTMENT_OTHER): Payer: Medicaid Other

## 2017-05-01 ENCOUNTER — Other Ambulatory Visit: Payer: Self-pay

## 2017-05-01 ENCOUNTER — Encounter (HOSPITAL_BASED_OUTPATIENT_CLINIC_OR_DEPARTMENT_OTHER): Payer: Self-pay | Admitting: Emergency Medicine

## 2017-05-01 DIAGNOSIS — B9789 Other viral agents as the cause of diseases classified elsewhere: Secondary | ICD-10-CM | POA: Insufficient documentation

## 2017-05-01 DIAGNOSIS — J069 Acute upper respiratory infection, unspecified: Secondary | ICD-10-CM | POA: Diagnosis not present

## 2017-05-01 DIAGNOSIS — Z79899 Other long term (current) drug therapy: Secondary | ICD-10-CM | POA: Insufficient documentation

## 2017-05-01 DIAGNOSIS — J45909 Unspecified asthma, uncomplicated: Secondary | ICD-10-CM | POA: Diagnosis not present

## 2017-05-01 DIAGNOSIS — R05 Cough: Secondary | ICD-10-CM | POA: Diagnosis present

## 2017-05-01 MED ORDER — CETIRIZINE-PSEUDOEPHEDRINE ER 5-120 MG PO TB12: 1.0000 | ORAL_TABLET | Freq: Two times a day (BID) | ORAL | 0 refills | Status: DC

## 2017-05-01 MED ORDER — FLUTICASONE PROPIONATE 50 MCG/ACT NA SUSP: 1.0000 | Freq: Every day | NASAL | 0 refills | Status: DC

## 2017-05-01 MED ORDER — ALBUTEROL SULFATE HFA 108 (90 BASE) MCG/ACT IN AERS: 1.0000 | INHALATION_SPRAY | Freq: Four times a day (QID) | RESPIRATORY_TRACT | 0 refills | Status: DC | PRN

## 2017-05-01 NOTE — ED Provider Notes (Signed)
[facility] EMERGENCY DEPARTMENT Provider Note   CSN: [id number] Arrival date & time: 05/01/17  1440     History   Chief Complaint Chief Complaint  Patient presents with  . Cough    HPI Jacob Church is a 42 y.o. male with history of asthma who presents with a less than 1 day history of cough and nasal congestion.  Patient reports he woke up this morning with nasal congestion and cough.  He has taken Mucinex over-the-counter which made his cough productive with green brown sputum.  He states it did help his symptoms.  He denies any fevers, sore throat, ear pain, chest pain, shortness of breath, or wheezing.  He is not taking any of his inhaler, although he states he does not know where it is.  He denies any abdominal pain, nausea, or vomiting.  HPI  Past Medical History:  Diagnosis Date  . Asthma   . GERD (gastroesophageal reflux disease)   . Pneumonia hx    Patient Active Problem List   Diagnosis Date Noted  . HNP (herniated nucleus pulposus), lumbar 11/12/2016    Past Surgical History:  Procedure Laterality Date  . BACK SURGERY     numerous  . KNEE SURGERY Bilateral    scope x2 rt. left menicus, acl rt   . LUMBAR LAMINECTOMY/DECOMPRESSION MICRODISCECTOMY Right 11/12/2016   Procedure: Microdiscectomy - right - Lumbar two-Lumbar three  extraforaminal;  Surgeon: Julio Sicks, MD;  Location: [facility] OR;  Service: Neurosurgery;  Laterality: Right;       Home Medications    Prior to Admission medications   Medication Sig Start Date End Date Taking? Authorizing Provider  albuterol (PROVENTIL HFA;VENTOLIN HFA) 108 (90 Base) MCG/ACT inhaler Inhale 1-2 puffs into the lungs every 6 (six) hours as needed for wheezing or shortness of breath. 05/01/17   Gino Garrabrant, Waylan Boga, PA-C  cetirizine-pseudoephedrine (ZYRTEC-D) 5-120 MG tablet Take 1 tablet by mouth 2 (two) times daily. 05/01/17   Derisha Funderburke, Waylan Boga, PA-C  fluticasone (FLONASE) 50 MCG/ACT nasal spray Place 1 spray into both  nostrils daily. 05/01/17   Emi Holes, PA-C  Multiple Vitamin (MULTIVITAMIN WITH MINERALS) TABS Take 1 tablet by mouth daily.    [provider]  naproxen (NAPROSYN) 375 MG tablet Take 375 mg by mouth 2 (two) times daily with a meal.    [provider]  ranitidine (ZANTAC) 75 MG tablet Take 75 mg by mouth 2 (two) times daily.    [provider]    Family History No family history on file.  Social History Social History   Tobacco Use  . Smoking status: Never Smoker  . Smokeless tobacco: Never Used  Substance Use Topics  . Alcohol use: Yes    Comment: occ  . Drug use: No     Allergies   Morphine and related and Shellfish allergy   Review of Systems Review of Systems  Constitutional: Negative for chills and fever.  HENT: Positive for congestion. Negative for sore throat.   Respiratory: Positive for cough. Negative for shortness of breath.   Cardiovascular: Negative for chest pain.  Gastrointestinal: Negative for abdominal pain, nausea and vomiting.  Skin: Negative for rash and wound.  Psychiatric/Behavioral: The patient is not nervous/anxious.      Physical Exam Updated Vital Signs BP 113/63 (BP Location: Left Arm)   Pulse 76   Temp 98.1 F (36.7 C) (Oral)   Resp 18   Ht 5\' 8"  (1.727 m)   Wt 86.2 kg (  190 lb)   SpO2 99%   BMI 28.89 kg/m   Physical Exam  Constitutional: He appears well-developed and well-nourished. No distress.  HENT:  Head: Normocephalic and atraumatic.  Mouth/Throat: Oropharynx is clear and moist. No oropharyngeal exudate.  Eyes: Conjunctivae are normal. Pupils are equal, round, and reactive to light. Right eye exhibits no discharge. Left eye exhibits no discharge. No scleral icterus.  Neck: Normal range of motion.  Cardiovascular: Normal rate, regular rhythm, normal heart sounds and intact distal pulses. Exam reveals no gallop and no friction rub.  No murmur heard. Pulmonary/Chest: Effort normal and breath  sounds normal. No stridor. No respiratory distress. He has no wheezes. He has no rales.  Musculoskeletal: He exhibits no edema.  Neurological: He is alert. Coordination normal.  Skin: Skin is warm and dry. No rash noted. He is not diaphoretic. No pallor.  Psychiatric: He has a normal mood and affect.  Nursing note and vitals reviewed.    ED Treatments / Results  Labs (all labs ordered are listed, but only abnormal results are displayed) Labs Reviewed - No data to display  EKG  EKG Interpretation None       Radiology Dg Chest 2 View  Result Date: 05/01/2017 CLINICAL DATA:  Productive cough and fever EXAM: CHEST  2 VIEW COMPARISON:  07/10/2014 FINDINGS: The lungs are clear without focal pneumonia, edema, pneumothorax or pleural effusion. The cardiopericardial silhouette is within normal limits for size. The visualized bony structures of the thorax are intact. IMPRESSION: No active cardiopulmonary disease. Electronically Signed   By: Kennith CenterEric  Mansell M.D.   On: 05/01/2017 16:27    Procedures Procedures (including critical care time)  Medications Ordered in ED Medications - No data to display   Initial Impression / Assessment and Plan / ED Course  I have reviewed the triage vital signs and the nursing notes.  Pertinent labs & imaging results that were available during my care of the patient were reviewed by me and considered in my medical decision making (see chart for details).     Pt symptoms consistent with URI. CXR negative for acute infiltrate. Pt will be discharged with symptomatic treatment, including Zyrtec-D, Flonase, and albuterol inhaler.  Will encourage patient to continue Mucinex.  Discussed return precautions.  Patient to follow-up with PCP if symptoms are not improving over the next 7-10 days.  Patient understands and agrees with plan.  Patient vitals stable throughout ED course and discharged in satisfactory condition.  Final Clinical Impressions(s) / ED Diagnoses     Final diagnoses:  Viral URI with cough    ED Discharge Orders        Ordered    cetirizine-pseudoephedrine (ZYRTEC-D) 5-120 MG tablet  2 times daily     05/01/17 1829    fluticasone (FLONASE) 50 MCG/ACT nasal spray  Daily     05/01/17 1829    albuterol (PROVENTIL HFA;VENTOLIN HFA) 108 (90 Base) MCG/ACT inhaler  Every 6 hours PRN     05/01/17 1829       Emi HolesLaw, Zerline Melchior M, PA-C 05/01/17 1831    Tilden Fossaees, Elizabeth, MD 05/02/17 [id number]

## 2017-05-01 NOTE — ED Triage Notes (Signed)
Cough and congestion since yesterday.

## 2017-05-01 NOTE — ED Notes (Signed)
Cough, congestion onset yesterday.

## 2017-05-01 NOTE — Discharge Instructions (Signed)
Medications: Zyrtec-D, Flonase, albuterol inhaler  Treatment: Take Zyrtec-D twice daily for nasal congestion.  You can also take Flonase as prescribed for nasal congestion as well.  Use albuterol inhaler every 4-6 hours as needed for shortness of breath, wheezing, or chest tightness.  Follow-up: Please follow-up with your doctor if your symptoms are not improving over the next 10 days.  Please return to the emergency department or see your doctor immediately if you develop any difficulty breathing, or significant wheezing unresolved by your inhaler.

## 2017-05-01 NOTE — ED Notes (Signed)
Pt given d/c instructions as per chart. Rx x 3. Verbalizes understanding. No questions. 

## 2017-06-04 ENCOUNTER — Encounter (HOSPITAL_BASED_OUTPATIENT_CLINIC_OR_DEPARTMENT_OTHER): Payer: Self-pay | Admitting: *Deleted

## 2017-06-04 ENCOUNTER — Other Ambulatory Visit: Payer: Self-pay

## 2017-06-04 ENCOUNTER — Emergency Department (HOSPITAL_BASED_OUTPATIENT_CLINIC_OR_DEPARTMENT_OTHER)
Admission: EM | Admit: 2017-06-04 | Discharge: 2017-06-04 | Disposition: A | Discharge status: Home / Self Care | Payer: Medicaid Other | Attending: Physician Assistant | Admitting: Physician Assistant

## 2017-06-04 DIAGNOSIS — Y999 Unspecified external cause status: Secondary | ICD-10-CM | POA: Insufficient documentation

## 2017-06-04 DIAGNOSIS — J45909 Unspecified asthma, uncomplicated: Secondary | ICD-10-CM | POA: Insufficient documentation

## 2017-06-04 DIAGNOSIS — X58XXXA Exposure to other specified factors, initial encounter: Secondary | ICD-10-CM | POA: Insufficient documentation

## 2017-06-04 DIAGNOSIS — S50319A Abrasion of unspecified elbow, initial encounter: Secondary | ICD-10-CM

## 2017-06-04 DIAGNOSIS — Z79899 Other long term (current) drug therapy: Secondary | ICD-10-CM | POA: Insufficient documentation

## 2017-06-04 DIAGNOSIS — Y929 Unspecified place or not applicable: Secondary | ICD-10-CM | POA: Insufficient documentation

## 2017-06-04 DIAGNOSIS — Y939 Activity, unspecified: Secondary | ICD-10-CM | POA: Insufficient documentation

## 2017-06-04 DIAGNOSIS — S50311A Abrasion of right elbow, initial encounter: Secondary | ICD-10-CM | POA: Diagnosis not present

## 2017-06-04 DIAGNOSIS — R21 Rash and other nonspecific skin eruption: Secondary | ICD-10-CM | POA: Diagnosis present

## 2017-06-04 NOTE — ED Triage Notes (Signed)
Rash to right elbow x 1 day. Itching.

## 2017-06-04 NOTE — ED Notes (Signed)
Ambulatory to d/c window with steady gait

## 2017-06-04 NOTE — ED Provider Notes (Signed)
[facility] EMERGENCY DEPARTMENT Provider Note   CSN: [id number] Arrival date & time: 06/04/17  1801     History   Chief Complaint Chief Complaint  Patient presents with  . Rash    HPI Jacob Church is a 42 y.o. male presenting to the ED for itchy rash to right elbow.  Patient states he woke up with small rash to right elbow that is burning and itchy.  Denies drainage, fever, or other complaints. No pain to joint.  No recent injuries.  No known insect bites.   The history is provided by the patient.    Past Medical History:  Diagnosis Date  . Asthma   . GERD (gastroesophageal reflux disease)   . Pneumonia hx    Patient Active Problem List   Diagnosis Date Noted  . HNP (herniated nucleus pulposus), lumbar 11/12/2016    Past Surgical History:  Procedure Laterality Date  . BACK SURGERY     numerous  . KNEE SURGERY Bilateral    scope x2 rt. left menicus, acl rt   . LUMBAR LAMINECTOMY/DECOMPRESSION MICRODISCECTOMY Right 11/12/2016   Procedure: Microdiscectomy - right - Lumbar two-Lumbar three  extraforaminal;  Surgeon: Julio SicksPool, Henry, MD;  [facility];  Service: Neurosurgery;  Laterality: Right;       Home Medications    Prior to Admission medications   Medication Sig Start Date End Date Taking? Authorizing Provider  albuterol (PROVENTIL HFA;VENTOLIN HFA) 108 (90 Base) MCG/ACT inhaler Inhale 1-2 puffs into the lungs every 6 (six) hours as needed for wheezing or shortness of breath. 05/01/17   Law, Waylan BogaAlexandra M, PA-C  cetirizine-pseudoephedrine (ZYRTEC-D) 5-120 MG tablet Take 1 tablet by mouth 2 (two) times daily. 05/01/17   Law, Waylan BogaAlexandra M, PA-C  fluticasone (FLONASE) 50 MCG/ACT nasal spray Place 1 spray into both nostrils daily. 05/01/17   Emi HolesLaw, Alexandra M, PA-C  Multiple Vitamin (MULTIVITAMIN WITH MINERALS) TABS Take 1 tablet by mouth daily.    [provider]  naproxen (NAPROSYN) 375 MG tablet Take 375 mg by mouth 2 (two) times daily with a meal.     [provider]  ranitidine (ZANTAC) 75 MG tablet Take 75 mg by mouth 2 (two) times daily.    [provider]    Family History History reviewed. No pertinent family history.  Social History Social History   Tobacco Use  . Smoking status: Never Smoker  . Smokeless tobacco: Never Used  Substance Use Topics  . Alcohol use: Yes    Comment: occ  . Drug use: No     Allergies   Morphine and related and Shellfish allergy   Review of Systems Review of Systems  Constitutional: Negative for fever.  Musculoskeletal: Negative for arthralgias.  Skin: Positive for rash.     Physical Exam Updated Vital Signs BP 112/76 (BP Location: Left Arm)   Pulse 88   Temp 98.7 F (37.1 C) (Oral)   Resp 16   Ht 5\' 8"  (1.727 m)   Wt 81.6 kg (180 lb)   SpO2 98%   BMI 27.37 kg/m   Physical Exam  Constitutional: He appears well-developed and well-nourished. No distress.  HENT:  Head: Normocephalic and atraumatic.  Eyes: Conjunctivae are normal.  Cardiovascular: Normal rate and intact distal pulses.  Pulmonary/Chest: Effort normal.  Skin:  Small quarter-sized superficial abrasion to posterior right elbow. Scab forming. No surrounding erythema or purulent drainage. No rash noted. Joint without edema, normal ROM.   Psychiatric: He has a normal mood and  affect. His behavior is normal.  Nursing note and vitals reviewed.    ED Treatments / Results  Labs (all labs ordered are listed, but only abnormal results are displayed) Labs Reviewed - No data to display  EKG  EKG Interpretation None       Radiology No results found.  Procedures Procedures (including critical care time)  Medications Ordered in ED Medications - No data to display   Initial Impression / Assessment and Plan / ED Course  I have reviewed the triage vital signs and the nursing notes.  Pertinent labs & imaging results that were available during my care of the patient were reviewed by me and  considered in my medical decision making (see chart for details).     Pt with small superficial abrasion to right elbow. No rash. No signs of infection. No joint involvement. Wound cleaned in ED and bacitracin applied. Discussed wound care. Safe for discharge.  Discussed results, findings, treatment and follow up. Patient advised of return precautions. Patient verbalized understanding and agreed with plan.  Final Clinical Impressions(s) / ED Diagnoses   Final diagnoses:  Abrasion of elbow, initial encounter    ED Discharge Orders    None       [name], [place] N, PA-C 06/04/17 2017    Abelino Derrick, MD 06/04/17 2345

## 2017-06-04 NOTE — Discharge Instructions (Signed)
Please read instructions below.  Keep your wound clean. You can apply antibiotic ointment, such as neosporin or bacitracin daily. Follow up with your primary care provider as needed.

## 2017-09-20 MED FILL — METHYLPREDNISOLONE 4 MG TAB: 4 | 6 days supply | Qty: 21 | Fill #0

## 2017-12-02 ENCOUNTER — Other Ambulatory Visit: Payer: Self-pay

## 2017-12-02 ENCOUNTER — Encounter (HOSPITAL_BASED_OUTPATIENT_CLINIC_OR_DEPARTMENT_OTHER): Payer: Self-pay | Admitting: *Deleted

## 2017-12-02 ENCOUNTER — Emergency Department (HOSPITAL_BASED_OUTPATIENT_CLINIC_OR_DEPARTMENT_OTHER): Payer: Medicaid Other

## 2017-12-02 ENCOUNTER — Emergency Department (HOSPITAL_BASED_OUTPATIENT_CLINIC_OR_DEPARTMENT_OTHER)
Admission: EM | Admit: 2017-12-02 | Discharge: 2017-12-02 | Disposition: A | Discharge status: Home / Self Care | Payer: Medicaid Other | Attending: Emergency Medicine | Admitting: Emergency Medicine

## 2017-12-02 DIAGNOSIS — Z5321 Procedure and treatment not carried out due to patient leaving prior to being seen by health care provider: Secondary | ICD-10-CM | POA: Insufficient documentation

## 2017-12-02 DIAGNOSIS — M25532 Pain in left wrist: Secondary | ICD-10-CM | POA: Insufficient documentation

## 2017-12-02 NOTE — ED Notes (Signed)
Called for a room X2 with no answer

## 2017-12-02 NOTE — ED Triage Notes (Addendum)
Pain in his left wrist x 3 months. States today he fell, after getting a bee sting, and pain in his wrist got worse.

## 2017-12-02 NOTE — ED Notes (Signed)
Called with no response

## 2017-12-05 NOTE — ED Notes (Addendum)
Follow up call made stated LWBS for family  Emergency  Will return if needed  12/05/17  0821  s Nikhita Mentzel rn

## 2017-12-14 ENCOUNTER — Ambulatory Visit (INDEPENDENT_AMBULATORY_CARE_PROVIDER_SITE_OTHER): Payer: Medicaid Other

## 2017-12-14 ENCOUNTER — Other Ambulatory Visit: Payer: Self-pay

## 2017-12-14 ENCOUNTER — Encounter (HOSPITAL_COMMUNITY): Payer: Self-pay

## 2017-12-14 ENCOUNTER — Ambulatory Visit (HOSPITAL_COMMUNITY)
Admission: EM | Admit: 2017-12-14 | Discharge: 2017-12-14 | Disposition: A | Discharge status: Home / Self Care | Payer: Medicaid Other | Attending: Family Medicine | Admitting: Family Medicine

## 2017-12-14 DIAGNOSIS — M25532 Pain in left wrist: Secondary | ICD-10-CM | POA: Diagnosis not present

## 2017-12-14 MED ORDER — NAPROXEN 375 MG PO TABS: 375.0000 mg | ORAL_TABLET | Freq: Two times a day (BID) | ORAL | 0 refills | Status: DC

## 2017-12-14 NOTE — ED Provider Notes (Signed)
[facility]   [id number] 12/14/17 Arrival Time: 1614  WU:[id number]:JOINT PAIN  SUBJECTIVE: History from: patient. Jacob Church is a RHD 42 y.o. male  complains of left wrist pain that began 3 months ago.  Denies a precipitating event or specific injury.  Localizes the pain to the medial aspect of wrist.  Describes the pain as constant and throbbing in character.  States his pain is 9/10.  Has tried ibuprofen without relief.  Symptoms are made worse with wrist ROM.  Denies similar symptoms in the past. Complains of swelling, and weakness.   Denies fever, chills, erythema, ecchymosis, numbness and tingling.      ROS: As per HPI.  Past Medical History:  Diagnosis Date  . Asthma   . GERD (gastroesophageal reflux disease)   . Pneumonia hx   Past Surgical History:  Procedure Laterality Date  . BACK SURGERY     numerous  . KNEE SURGERY Bilateral    scope x2 rt. left menicus, acl rt   . LUMBAR LAMINECTOMY/DECOMPRESSION MICRODISCECTOMY Right 11/12/2016   Procedure: Microdiscectomy - right - Lumbar two-Lumbar three  extraforaminal;  Surgeon: Julio SicksPool, Henry, MD;  [facility];  Service: Neurosurgery;  Laterality: Right;   Allergies  Allergen Reactions  . Morphine And Related Other (See Comments)    Side effects are extreme, advised by his doctor not to take  . Shellfish Allergy Anaphylaxis and Itching   No current facility-administered medications on file prior to encounter.    Current Outpatient Medications on File Prior to Encounter  Medication Sig Dispense Refill  . albuterol (PROVENTIL HFA;VENTOLIN HFA) 108 (90 Base) MCG/ACT inhaler Inhale 1-2 puffs into the lungs every 6 (six) hours as needed for wheezing or shortness of breath. (Patient not taking: Reported on 12/14/2017) 1 Inhaler 0  . cetirizine-pseudoephedrine (ZYRTEC-D) 5-120 MG tablet Take 1 tablet by mouth 2 (two) times daily. (Patient not taking: Reported on 12/14/2017) 30 tablet 0  . fluticasone (FLONASE) 50 MCG/ACT nasal  spray Place 1 spray into both nostrils daily. (Patient not taking: Reported on 12/14/2017) 16 g 0  . Multiple Vitamin (MULTIVITAMIN WITH MINERALS) TABS Take 1 tablet by mouth daily.     Social History   Socioeconomic History  . Marital status: Divorced    Spouse name: Not on file  . Number of children: Not on file  . Years of education: Not on file  . Highest education level: Not on file  Occupational History  . Not on file  Social Needs  . Financial resource strain: Not on file  . Food insecurity:    Worry: Not on file    Inability: Not on file  . Transportation needs:    Medical: Not on file    Non-medical: Not on file  Tobacco Use  . Smoking status: Never Smoker  . Smokeless tobacco: Never Used  Substance and Sexual Activity  . Alcohol use: Yes    Comment: occ  . Drug use: No  . Sexual activity: Not on file  Lifestyle  . Physical activity:    Days per week: Not on file    Minutes per session: Not on file  . Stress: Not on file  Relationships  . Social connections:    Talks on phone: Not on file    Gets together: Not on file    Attends religious service: Not on file    Active member of club or organization: Not on file    Attends meetings of clubs or organizations: Not on  file    Relationship status: Not on file  . Intimate partner violence:    Fear of current or ex partner: Not on file    Emotionally abused: Not on file    Physically abused: Not on file    Forced sexual activity: Not on file  Other Topics Concern  . Not on file  Social History Narrative  . Not on file   History reviewed. No pertinent family history.  OBJECTIVE:  Vitals:   12/14/17 1627 12/14/17 1629 12/14/17 1643  BP: 132/69    Pulse: 68    Resp: 19    Temp: 98.3 F (36.8 C)    TempSrc: Oral    SpO2: (!) 89%  98%  Weight:  180 lb (81.6 kg)     General appearance: AOx3; in no acute distress.  Head: NCAT Lungs: CTA bilaterally Heart: RRR.  Clear S1 and S2 without murmur, gallops,  or rubs.  Radial pulses 2+ bilaterally. Musculoskeletal: Left wrist Inspection: Skin warm, dry, clear and intact without obvious erythema, effusion, or ecchymosis.  Palpation: tender over the distal ulna and medial carpal bones ROM: FROM active and passive; supination and pronation Strength: 5/5 shld abduction, 5/5 shld adduction, 5/5 elbow flexion, 5/5 elbow extension, 5/5 grip strength, 5/5 wrist flexion, 4+wrist extension Skin: warm and dry Neurologic: Ambulates without difficulty; Sensation intact about the upper extremities Psychological: alert and cooperative; normal mood and affect  DIAGNOSTIC STUDIES:   Dg Wrist Complete Left  Result Date: 12/14/2017 CLINICAL DATA:  Left wrist pain starting 3 weeks ago. EXAM: LEFT WRIST - COMPLETE 3+ VIEW COMPARISON:  12/02/2017 FINDINGS: There is no evidence of fracture or dislocation. The radiocarpal and midcarpal joints are intact and maintained. No widening of the intrinsic intercarpal ligament spaces. There is no evidence of arthropathy or other focal bone abnormality. Soft tissues are unremarkable. IMPRESSION: No radiographic findings for the patient's left wrist pain. Electronically Signed   By: Tollie Ethavid  Kwon M.D.   On: 12/14/2017 17:17   Dg Wrist Complete Left  Result Date: 12/02/2017 CLINICAL DATA:  Left wrist pain for 3 months. EXAM: LEFT WRIST - COMPLETE 3+ VIEW COMPARISON:  None. FINDINGS: There is no evidence of fracture or dislocation. There is no evidence of arthropathy or other focal bone abnormality. Soft tissues are unremarkable. IMPRESSION: Negative. Electronically Signed   By: Sherian ReinWei-Chen  Lin M.D.   On: 12/02/2017 14:02    ASSESSMENT & PLAN:  1. Left wrist pain    Meds ordered this encounter  Medications  . naproxen (NAPROSYN) 375 MG tablet    Sig: Take 1 tablet (375 mg total) by mouth 2 (two) times daily.    Dispense:  20 tablet    Refill:  0    Order Specific Question:   Supervising Provider    Answer:   Isa RankinMURRAY, LAURA WILSON  [[id number]]   Brace given Continue conservative management of rest, ice, compression, and elevation Take naproxen as needed for pain relief (may cause abdominal discomfort, ulcers, and GI bleeds avoid taking with other NSAIDs) Follow up with PCP if symptoms persist Return or go to the ER if you have any new or worsening symptoms (fever, chills, chest pain, abdominal pain, changes in bowel or bladder habits, pain radiating into lower legs, etc...)   Reviewed expectations re: course of current medical issues. Questions answered. Outlined signs and symptoms indicating need for more acute intervention. Patient verbalized understanding. After Visit Summary given.    Rennis HardingWurst, Orin Eberwein, PA-C 12/14/17 1818

## 2017-12-14 NOTE — ED Triage Notes (Signed)
X 3 months right wrist pain

## 2017-12-14 NOTE — Discharge Instructions (Addendum)
Brace given Continue conservative management of rest, ice, compression, and elevation Take naproxen as needed for pain relief (may cause abdominal discomfort, ulcers, and GI bleeds avoid taking with other NSAIDs) Follow up with PCP if symptoms persist Return or go to the ER if you have any new or worsening symptoms (fever, chills, chest pain, abdominal pain, changes in bowel or bladder habits, pain radiating into lower legs, etc...)

## 2018-04-07 ENCOUNTER — Emergency Department (HOSPITAL_BASED_OUTPATIENT_CLINIC_OR_DEPARTMENT_OTHER): Payer: Medicaid Other

## 2018-04-07 ENCOUNTER — Emergency Department (HOSPITAL_BASED_OUTPATIENT_CLINIC_OR_DEPARTMENT_OTHER)
Admission: EM | Admit: 2018-04-07 | Discharge: 2018-04-07 | Disposition: A | Discharge status: Home / Self Care | Payer: Medicaid Other | Attending: Emergency Medicine | Admitting: Emergency Medicine

## 2018-04-07 ENCOUNTER — Encounter (HOSPITAL_BASED_OUTPATIENT_CLINIC_OR_DEPARTMENT_OTHER): Payer: Self-pay | Admitting: *Deleted

## 2018-04-07 ENCOUNTER — Other Ambulatory Visit: Payer: Self-pay

## 2018-04-07 DIAGNOSIS — Z79899 Other long term (current) drug therapy: Secondary | ICD-10-CM | POA: Insufficient documentation

## 2018-04-07 DIAGNOSIS — M25562 Pain in left knee: Secondary | ICD-10-CM | POA: Insufficient documentation

## 2018-04-07 DIAGNOSIS — J45909 Unspecified asthma, uncomplicated: Secondary | ICD-10-CM | POA: Insufficient documentation

## 2018-04-07 MED ORDER — ACETAMINOPHEN 325 MG PO TABS
650.0000 mg | ORAL_TABLET | Freq: Once | ORAL | Status: AC
  Administered 2018-04-07: 650 mg via ORAL

## 2018-04-07 MED ORDER — IBUPROFEN 400 MG PO TABS
600.0000 mg | ORAL_TABLET | Freq: Once | ORAL | Status: AC
  Administered 2018-04-07: 600 mg via ORAL

## 2018-04-07 NOTE — Discharge Instructions (Addendum)
Take ibuprofen and tylenol for the pain  Call the sports medicine physician for follow up

## 2018-04-07 NOTE — ED Provider Notes (Signed)
[facility] EMERGENCY DEPARTMENT Provider Note   CSN: [id number] Arrival date & time: 04/07/18  1102     History   Chief Complaint Chief Complaint  Patient presents with  . Knee Injury    HPI Jacob Church is a 42 y.o. male.  HPI Patient is a healthy 42 year old male presents emergency department with complaints of left knee pain since wrestling with his family member last night.  He felt pain in his medial aspect of his right knee during maneuver on the mat.  He states he is having difficulty straightening his left leg now.  He can continue to walk but reports some pain with straightening.  He has had a meniscal injury before and he states this feels similar.  Pain is mild in severity.  No medications prior to arrival.   Past Medical History:  Diagnosis Date  . Asthma   . GERD (gastroesophageal reflux disease)   . Pneumonia hx    Patient Active Problem List   Diagnosis Date Noted  . HNP (herniated nucleus pulposus), lumbar 11/12/2016    Past Surgical History:  Procedure Laterality Date  . BACK SURGERY     numerous  . KNEE SURGERY Bilateral    scope x2 rt. left menicus, acl rt   . LUMBAR LAMINECTOMY/DECOMPRESSION MICRODISCECTOMY Right 11/12/2016   Procedure: Microdiscectomy - right - Lumbar two-Lumbar three  extraforaminal;  Surgeon: Julio Sicks, MD;  Location: [facility] OR;  Service: Neurosurgery;  Laterality: Right;        Home Medications    Prior to Admission medications   Medication Sig Start Date End Date Taking? Authorizing Provider  albuterol (PROVENTIL HFA;VENTOLIN HFA) 108 (90 Base) MCG/ACT inhaler Inhale 1-2 puffs into the lungs every 6 (six) hours as needed for wheezing or shortness of breath. Patient not taking: Reported on 12/14/2017 05/01/17   Emi Holes, PA-C  cetirizine-pseudoephedrine (ZYRTEC-D) 5-120 MG tablet Take 1 tablet by mouth 2 (two) times daily. Patient not taking: Reported on 12/14/2017 05/01/17   Emi Holes, PA-C    fluticasone [facility]) 50 MCG/ACT nasal spray Place 1 spray into both nostrils daily. Patient not taking: Reported on 12/14/2017 05/01/17   Emi Holes, PA-C  Multiple Vitamin (MULTIVITAMIN WITH MINERALS) TABS Take 1 tablet by mouth daily.    [provider]  naproxen (NAPROSYN) 375 MG tablet Take 1 tablet (375 mg total) by mouth 2 (two) times daily. 12/14/17   Rennis Harding, PA-C    Family History No family history on file.  Social History Social History   Tobacco Use  . Smoking status: Never Smoker  . Smokeless tobacco: Never Used  Substance Use Topics  . Alcohol use: Yes    Comment: occ  . Drug use: No     Allergies   Morphine and related and Shellfish allergy   Review of Systems Review of Systems  All other systems reviewed and are negative.    Physical Exam Updated Vital Signs BP 109/73   Pulse 78   Temp 99.5 F (37.5 C) (Oral)   Resp 20   Ht 5\' 8"  (1.727 m)   Wt 80.3 kg   SpO2 98%   BMI 26.91 kg/m   Physical Exam Vitals signs and nursing note reviewed.  Constitutional:      Appearance: He is well-developed.  HENT:     Head: Normocephalic.  Neck:     Musculoskeletal: Normal range of motion.  Pulmonary:     Effort: Pulmonary effort is normal.  Abdominal:  General: There is no distension.  Musculoskeletal: Normal range of motion.     Comments: Full range of motion of left knee.  Some difficulty straightening completely.  No obvious joint effusion on examination.  Normal PT and DP pulse.  No swelling of the left lower extremity as compared to the right.  No warmth or erythema of the left knee  Neurological:     Mental Status: He is alert and oriented to person, place, and time.      ED Treatments / Results  Labs (all labs ordered are listed, but only abnormal results are displayed) Labs Reviewed - No data to display  EKG None  Radiology No results found.  Procedures Procedures (including critical care time)  Medications  Ordered in ED Medications  ibuprofen (ADVIL,MOTRIN) tablet 600 mg (has no administration in time range)  acetaminophen (TYLENOL) tablet 650 mg (has no administration in time range)     Initial Impression / Assessment and Plan / ED Course  I have reviewed the triage vital signs and the nursing notes.  Pertinent labs & imaging results that were available during my care of the patient were reviewed by me and considered in my medical decision making (see chart for details).     Suspect internal derangement.  Concerning for meniscal injury.  Plain film to be obtained to evaluate osseous structures.  Likely outpatient follow-up with sports medicine.  Crutches.  NSAIDs.  Final Clinical Impressions(s) / ED Diagnoses   Final diagnoses:  None    ED Discharge Orders    None       Azalia Bilisampos, Deston Bilyeu, MD 04/07/18 1151

## 2018-04-07 NOTE — ED Notes (Signed)
ED Provider at bedside. 

## 2018-04-07 NOTE — ED Triage Notes (Signed)
Left knee injury yesterday. He was on his knees helping his son during wrestling practice and felt pain in his medial knee area.

## 2019-01-23 ENCOUNTER — Emergency Department (HOSPITAL_BASED_OUTPATIENT_CLINIC_OR_DEPARTMENT_OTHER)
Admission: EM | Admit: 2019-01-23 | Discharge: 2019-01-23 | Disposition: A | Discharge status: Home / Self Care | Payer: Medicaid Other | Attending: Emergency Medicine | Admitting: Emergency Medicine

## 2019-01-23 ENCOUNTER — Other Ambulatory Visit: Payer: Self-pay

## 2019-01-23 ENCOUNTER — Encounter (HOSPITAL_BASED_OUTPATIENT_CLINIC_OR_DEPARTMENT_OTHER): Payer: Self-pay

## 2019-01-23 DIAGNOSIS — H5711 Ocular pain, right eye: Secondary | ICD-10-CM | POA: Diagnosis present

## 2019-01-23 DIAGNOSIS — J45909 Unspecified asthma, uncomplicated: Secondary | ICD-10-CM | POA: Diagnosis not present

## 2019-01-23 DIAGNOSIS — H00012 Hordeolum externum right lower eyelid: Secondary | ICD-10-CM | POA: Diagnosis not present

## 2019-01-23 DIAGNOSIS — Z79899 Other long term (current) drug therapy: Secondary | ICD-10-CM | POA: Diagnosis not present

## 2019-01-23 MED ORDER — ERYTHROMYCIN 5 MG/GM OP OINT: TOPICAL_OINTMENT | OPHTHALMIC | 0 refills | Status: DC

## 2019-01-23 NOTE — ED Provider Notes (Signed)
[facility] EMERGENCY DEPARTMENT Provider Note   CSN: [id number] Arrival date & time: 01/23/19  1449     History   Chief Complaint Chief Complaint  Patient presents with  . Stye    HPI Jacob Church is a 43 y.o. male.     43 y/o male with no pmh presents to the ED for stye of the right bottom lid that is not going away for at least 1 week. Denies trouble seeing, red eye, drainage, fever. Has not tried anything for relief.      Past Medical History:  Diagnosis Date  . Asthma   . GERD (gastroesophageal reflux disease)   . Pneumonia hx    Patient Active Problem List   Diagnosis Date Noted  . HNP (herniated nucleus pulposus), lumbar 11/12/2016    Past Surgical History:  Procedure Laterality Date  . BACK SURGERY     numerous  . KNEE SURGERY Bilateral    scope x2 rt. left menicus, acl rt   . LUMBAR LAMINECTOMY/DECOMPRESSION MICRODISCECTOMY Right 11/12/2016   Procedure: Microdiscectomy - right - Lumbar two-Lumbar three  extraforaminal;  Surgeon: Julio Sicks, MD;  Location: [facility] OR;  Service: Neurosurgery;  Laterality: Right;        Home Medications    Prior to Admission medications   Medication Sig Start Date End Date Taking? Authorizing Provider  albuterol (PROVENTIL HFA;VENTOLIN HFA) 108 (90 Base) MCG/ACT inhaler Inhale 1-2 puffs into the lungs every 6 (six) hours as needed for wheezing or shortness of breath. Patient not taking: Reported on 12/14/2017 05/01/17   Emi Holes, PA-C  cetirizine-pseudoephedrine (ZYRTEC-D) 5-120 MG tablet Take 1 tablet by mouth 2 (two) times daily. Patient not taking: Reported on 12/14/2017 05/01/17   Emi Holes, PA-C  erythromycin ophthalmic ointment Place a 1/2 inch ribbon of ointment into the lower eyelid up to three times daily 01/23/19   Ronnie Doss A, PA-C  fluticasone [facility]) 50 MCG/ACT nasal spray Place 1 spray into both nostrils daily. Patient not taking: Reported on 12/14/2017 05/01/17   Emi Holes,  PA-C  Multiple Vitamin (MULTIVITAMIN WITH MINERALS) TABS Take 1 tablet by mouth daily.    [provider]  naproxen (NAPROSYN) 375 MG tablet Take 1 tablet (375 mg total) by mouth 2 (two) times daily. 12/14/17   Rennis Harding, PA-C    Family History No family history on file.  Social History Social History   Tobacco Use  . Smoking status: Never Smoker  . Smokeless tobacco: Never Used  Substance Use Topics  . Alcohol use: Yes    Comment: occ  . Drug use: No     Allergies   Morphine and related and Shellfish allergy   Review of Systems Review of Systems  Constitutional: Negative for chills and fever.  HENT: Negative for congestion, dental problem, rhinorrhea and sinus pain.   Eyes: Negative for photophobia, pain, discharge, redness and itching.       Lid swelling  Respiratory: Negative for cough and shortness of breath.   Neurological: Negative for dizziness.  All other systems reviewed and are negative.    Physical Exam Updated Vital Signs BP 125/84 (BP Location: Left Arm)   Pulse 93   Temp 98.4 F (36.9 C) (Oral)   Resp 20   Ht 5\' 8"  (1.727 m)   Wt 84.4 kg   SpO2 99%   BMI 28.28 kg/m   Physical Exam Vitals signs and nursing note reviewed.  Constitutional:  Appearance: Normal appearance.  HENT:     Head: Normocephalic.     Mouth/Throat:     Mouth: Mucous membranes are moist.  Eyes:     General:        Right eye: Hordeolum present. No foreign body or discharge.        Left eye: No foreign body or discharge.     Extraocular Movements: Extraocular movements intact.     Conjunctiva/sclera: Conjunctivae normal.     Right eye: Right conjunctiva is not injected. No chemosis, exudate or hemorrhage.    Left eye: Left conjunctiva is not injected. No chemosis, exudate or hemorrhage.  Pulmonary:     Effort: Pulmonary effort is normal.  Skin:    General: Skin is dry.  Neurological:     Mental Status: He is alert.  Psychiatric:        Mood and  Affect: Mood normal.      ED Treatments / Results  Labs (all labs ordered are listed, but only abnormal results are displayed) Labs Reviewed - No data to display  EKG None  Radiology No results found.  Procedures Procedures (including critical care time)  Medications Ordered in ED Medications - No data to display   Initial Impression / Assessment and Plan / ED Course  I have reviewed the triage vital signs and the nursing notes.  Pertinent labs & imaging results that were available during my care of the patient were reviewed by me and considered in my medical decision making (see chart for details).  Clinical Course as of Jan 23 1619  Tue Jan 23, 2019  1619 Right eye lower external hordeolum, no cellulitis. Given erythromycin ointment and advised on conservative tx and return precautions   [KM]    Clinical Course User Index [KM] Alveria Apley, PA-C       Based on review of vitals, medical screening exam, lab work and/or imaging, there does not appear to be an acute, emergent etiology for the patient's symptoms. Counseled pt on good return precautions and encouraged both PCP and ED follow-up as needed.  Prior to discharge, I also discussed incidental imaging findings with patient in detail and advised appropriate, recommended follow-up in detail.  Clinical Impression: 1. Hordeolum externum of right lower eyelid     Disposition: Discharge  Prior to providing a prescription for a controlled substance, I independently reviewed the patient's recent prescription history on the [facility]. The patient had no recent or regular prescriptions and was deemed appropriate for a brief, less than 3 day prescription of narcotic for acute analgesia.  This note was prepared with assistance of Systems analyst. Occasional wrong-word or sound-a-like substitutions may have occurred due to the inherent limitations of voice  recognition software.   Final Clinical Impressions(s) / ED Diagnoses   Final diagnoses:  Hordeolum externum of right lower eyelid    ED Discharge Orders         Ordered    erythromycin ophthalmic ointment     01/23/19 1620           Alveria Apley, PA-C 01/23/19 1621    Little, Wenda Overland, MD 01/24/19 1414

## 2019-01-23 NOTE — Discharge Instructions (Signed)
Thank you for allowing me to care for you today. Please return to the emergency department if you have new or worsening symptoms. Take your medications as instructed.  ° °

## 2019-01-23 NOTE — ED Triage Notes (Signed)
Pt c/o ?stye to right lower eye lid x 2 weeks-NAD-steady gait

## 2019-05-16 ENCOUNTER — Encounter (HOSPITAL_BASED_OUTPATIENT_CLINIC_OR_DEPARTMENT_OTHER): Payer: Self-pay | Admitting: *Deleted

## 2019-05-16 ENCOUNTER — Other Ambulatory Visit: Payer: Self-pay

## 2019-05-16 ENCOUNTER — Emergency Department (HOSPITAL_BASED_OUTPATIENT_CLINIC_OR_DEPARTMENT_OTHER)
Admission: EM | Admit: 2019-05-16 | Discharge: 2019-05-16 | Disposition: A | Discharge status: Home / Self Care | Payer: Medicaid Other | Attending: Emergency Medicine | Admitting: Emergency Medicine

## 2019-05-16 ENCOUNTER — Emergency Department (HOSPITAL_BASED_OUTPATIENT_CLINIC_OR_DEPARTMENT_OTHER): Payer: Medicaid Other

## 2019-05-16 DIAGNOSIS — R109 Unspecified abdominal pain: Secondary | ICD-10-CM

## 2019-05-16 DIAGNOSIS — Z79899 Other long term (current) drug therapy: Secondary | ICD-10-CM | POA: Insufficient documentation

## 2019-05-16 DIAGNOSIS — R200 Anesthesia of skin: Secondary | ICD-10-CM | POA: Diagnosis not present

## 2019-05-16 DIAGNOSIS — Z91013 Allergy to seafood: Secondary | ICD-10-CM | POA: Diagnosis not present

## 2019-05-16 DIAGNOSIS — J45909 Unspecified asthma, uncomplicated: Secondary | ICD-10-CM | POA: Insufficient documentation

## 2019-05-16 DIAGNOSIS — Z885 Allergy status to narcotic agent status: Secondary | ICD-10-CM | POA: Diagnosis not present

## 2019-05-16 DIAGNOSIS — R1032 Left lower quadrant pain: Secondary | ICD-10-CM | POA: Diagnosis not present

## 2019-05-16 LAB — CBC WITH DIFFERENTIAL/PLATELET
Abs Immature Granulocytes: 0.04 10*3/uL (ref 0.00–0.07)
Basophils Absolute: 0 10*3/uL (ref 0.0–0.1)
Basophils Relative: 0 %
Eosinophils Absolute: 0.2 10*3/uL (ref 0.0–0.5)
Eosinophils Relative: 4 %
HCT: 48.4 % (ref 39.0–52.0)
Hemoglobin: 16.2 g/dL (ref 13.0–17.0)
Immature Granulocytes: 1 %
Lymphocytes Relative: 25 %
Lymphs Abs: 1.3 10*3/uL (ref 0.7–4.0)
MCH: 30.3 pg (ref 26.0–34.0)
MCHC: 33.5 g/dL (ref 30.0–36.0)
MCV: 90.6 fL (ref 80.0–100.0)
Monocytes Absolute: 0.6 10*3/uL (ref 0.1–1.0)
Monocytes Relative: 12 %
Neutro Abs: 3.2 10*3/uL (ref 1.7–7.7)
Neutrophils Relative %: 58 %
Platelets: 261 10*3/uL (ref 150–400)
RBC: 5.34 MIL/uL (ref 4.22–5.81)
RDW: 13.2 % (ref 11.5–15.5)
WBC: 5.4 10*3/uL (ref 4.0–10.5)
nRBC: 0 % (ref 0.0–0.2)

## 2019-05-16 LAB — URINALYSIS, ROUTINE W REFLEX MICROSCOPIC
Bilirubin Urine: NEGATIVE
Glucose, UA: NEGATIVE mg/dL
Hgb urine dipstick: NEGATIVE
Ketones, ur: NEGATIVE mg/dL
Leukocytes,Ua: NEGATIVE
Nitrite: NEGATIVE
Protein, ur: NEGATIVE mg/dL
Specific Gravity, Urine: 1.02 (ref 1.005–1.030)
pH: 7 (ref 5.0–8.0)

## 2019-05-16 LAB — COMPREHENSIVE METABOLIC PANEL
ALT: 24 U/L (ref 0–44)
AST: 30 U/L (ref 15–41)
Albumin: 4.2 g/dL (ref 3.5–5.0)
Alkaline Phosphatase: 38 U/L (ref 38–126)
Anion gap: 7 (ref 5–15)
BUN: 15 mg/dL (ref 6–20)
CO2: 24 mmol/L (ref 22–32)
Calcium: 9.3 mg/dL (ref 8.9–10.3)
Chloride: 107 mmol/L (ref 98–111)
Creatinine, Ser: 1.19 mg/dL (ref 0.61–1.24)
GFR calc Af Amer: >60 mL/min (ref >60)
GFR calc non Af Amer: >60 mL/min (ref >60)
Glucose, Bld: 99 mg/dL (ref 70–99)
Potassium: 4.1 mmol/L (ref 3.5–5.1)
Sodium: 138 mmol/L (ref 135–145)
Total Bilirubin: 1.5 mg/dL — ABNORMAL HIGH (ref 0.3–1.2)
Total Protein: 7.1 g/dL (ref 6.5–8.1)

## 2019-05-16 MED ORDER — ORPHENADRINE CITRATE ER 100 MG PO TB12: 100.0000 mg | ORAL_TABLET | Freq: Two times a day (BID) | ORAL | 0 refills | Status: AC

## 2019-05-16 MED ORDER — NAPROXEN 375 MG PO TABS: 375.0000 mg | ORAL_TABLET | Freq: Two times a day (BID) | ORAL | 0 refills | Status: AC

## 2019-05-16 MED FILL — ORPHENADRINE ER 100 MG TABL: 100 | 15 days supply | Qty: 30 | Fill #0

## 2019-05-16 MED FILL — NAPROXEN 375 MG TABLET: 375 | 10 days supply | Qty: 20 | Fill #0

## 2019-05-16 NOTE — ED Triage Notes (Signed)
Left flank pain started 6 weeks ago and it gets worst.

## 2019-05-16 NOTE — ED Provider Notes (Signed)
[facility] EMERGENCY DEPARTMENT Provider Note   CSN: [id number] Arrival date & time: 05/16/19  1735     History Chief Complaint  Patient presents with  . Flank pain    Jacob Church is a 44 y.o. male.  HPI Patient reports has been having pain in his left flank area for about 6 weeks.  It has been fairly persistent.  There is a deep aching quality.  It radiates a little bit around towards the abdomen.  No nausea or vomiting.  He does note there has been some burning with urination.  He has not seen any blood in the urine.  No history of kidney stone.  Patient reports he does work out quite a bit and has been able to continue his regular routine although his back is uncomfortable.  He reports he has had surgery in the lower back before which left him with some numbness of portions of his legs and feet.  He reports this is chronic in nature.  He however continues to exercise aggressively.  He typically runs 3 miles at a time with no difficulty but did recently do about a 6 mile run which he felt was a bit taxing for him.  He reports his pain has a different quality than is more chronic back pain that he has had off and on.    Past Medical History:  Diagnosis Date  . Asthma   . GERD (gastroesophageal reflux disease)   . Pneumonia hx    Patient Active Problem List   Diagnosis Date Noted  . HNP (herniated nucleus pulposus), lumbar 11/12/2016    Past Surgical History:  Procedure Laterality Date  . BACK SURGERY     numerous  . KNEE SURGERY Bilateral    scope x2 rt. left menicus, acl rt   . LUMBAR LAMINECTOMY/DECOMPRESSION MICRODISCECTOMY Right 11/12/2016   Procedure: Microdiscectomy - right - Lumbar two-Lumbar three  extraforaminal;  Surgeon: Julio Sicks, MD;  Location: [facility] OR;  Service: Neurosurgery;  Laterality: Right;       History reviewed. No pertinent family history.  Social History   Tobacco Use  . Smoking status: Never Smoker  . Smokeless tobacco: Never  Used  Substance Use Topics  . Alcohol use: Yes    Comment: occ  . Drug use: Yes    Types: Marijuana    Comment: 4 times a week    Home Medications Prior to Admission medications   Medication Sig Start Date End Date Taking? Authorizing Provider  Multiple Vitamin (MULTIVITAMIN WITH MINERALS) TABS Take 1 tablet by mouth daily.   Yes [provider]  albuterol (PROVENTIL HFA;VENTOLIN HFA) 108 (90 Base) MCG/ACT inhaler Inhale 1-2 puffs into the lungs every 6 (six) hours as needed for wheezing or shortness of breath. Patient not taking: Reported on 12/14/2017 05/01/17   Emi Holes, PA-C  naproxen (NAPROSYN) 375 MG tablet Take 1 tablet (375 mg total) by mouth 2 (two) times daily. 05/16/19   Arby Barrette, MD  orphenadrine (NORFLEX) 100 MG tablet Take 1 tablet (100 mg total) by mouth 2 (two) times daily. 05/16/19   Arby Barrette, MD    Allergies    Morphine and related and Shellfish allergy  Review of Systems   Review of Systems 10 Systems reviewed and are negative for acute change except as noted in the HPI.  Physical Exam Updated Vital Signs BP 127/76 (BP Location: Right Arm)   Pulse 80   Temp 98.9 F (37.2 C) (Oral)  Resp 18   SpO2 98%   Physical Exam Constitutional:      Appearance: Normal appearance.  Eyes:     Extraocular Movements: Extraocular movements intact.  Cardiovascular:     Rate and Rhythm: Normal rate and regular rhythm.  Pulmonary:     Effort: Pulmonary effort is normal.     Breath sounds: Normal breath sounds.  Abdominal:     General: There is no distension.     Palpations: Abdomen is soft.     Tenderness: There is abdominal tenderness. There is no guarding.     Comments: Patient has some reproducible discomfort to deeper palpation in the left flank just below the rib cage and some discomfort to deeper palpation in the left anterior upper abdomen more laterally.  Lower abdomen is nontender.  Right abdomen and epigastrium nontender.    Musculoskeletal:        General: No swelling or tenderness. Normal range of motion.     Right lower leg: No edema.     Left lower leg: No edema.  Skin:    General: Skin is warm and dry.  Neurological:     General: No focal deficit present.     Mental Status: He is alert and oriented to person, place, and time.     Coordination: Coordination normal.  Psychiatric:        Mood and Affect: Mood normal.     ED Results / Procedures / Treatments   Labs (all labs ordered are listed, but only abnormal results are displayed) Labs Reviewed  COMPREHENSIVE METABOLIC PANEL - Abnormal; Notable for the following components:      Result Value   Total Bilirubin 1.5 (*)    All other components within normal limits  URINALYSIS, ROUTINE W REFLEX MICROSCOPIC  CBC WITH DIFFERENTIAL/PLATELET    EKG None  Radiology CT Renal Stone Study  Result Date: 05/16/2019 CLINICAL DATA:  Left flank pain EXAM: CT ABDOMEN AND PELVIS WITHOUT CONTRAST TECHNIQUE: Multidetector CT imaging of the abdomen and pelvis was performed following the standard protocol without oral or IV contrast. COMPARISON:  October 15, 2016 FINDINGS: Lower chest: Lung bases are clear. Hepatobiliary: No focal liver lesions are evident on this noncontrast enhanced study. Gallbladder wall is not appreciably thickened. There is no biliary duct dilatation. Pancreas: There is no pancreatic mass or inflammatory focus. Spleen: No splenic lesions are evident. Adrenals/Urinary Tract: Adrenals bilaterally appear unremarkable. There is a 7 mm apparent cyst in the lateral mid right kidney. No evidence of hydronephrosis on either side. There is no demonstrable renal or ureteral calculus on either side. Urinary bladder is midline with wall thickness within normal limits. Stomach/Bowel: There is no appreciable bowel wall or mesenteric thickening. Terminal ileum appears normal. No evident bowel obstruction. There is no appreciable free air or portal venous air.  Vascular/Lymphatic: There is no abdominal aortic aneurysm. There is slight calcification in the right common iliac artery. There is no evident adenopathy in the abdomen or pelvis. Reproductive: Prostate and seminal vesicles are normal in size and contour. No pelvic masses are evident. Other: Appendix appears normal. There is no evident abscess or ascites in the abdomen or pelvis. Musculoskeletal: There is degenerative change in the lower lumbar spine. There are no blastic or lytic bone lesions. There is no intramuscular or abdominal wall lesion. IMPRESSION: 1. A cause for patient's symptoms has not been established with this study. 2. No renal or ureteral calculus. No hydronephrosis. Urinary bladder wall thickness is normal. 3. No bowel  obstruction. No abscess in the abdomen or pelvis. Appendix appears normal. Electronically Signed   By: Bretta Bang III M.D.   On: 05/16/2019 09:57    Procedures Procedures (including critical care time)  Medications Ordered in ED Medications - No data to display  ED Course  I have reviewed the triage vital signs and the nursing notes.  Pertinent labs & imaging results that were available during my care of the patient were reviewed by me and considered in my medical decision making (see chart for details).    MDM Rules/Calculators/A&P                     Patient has had 6 weeks of left flank pain.  He is clinically well and maintaining his usual activities.  CT scan does not identify any stones or other etiologies.  Urinalysis is negative and renal function normal.  Patient does have reproducible pain.  At this time will treat as musculoskeletal pain with naproxen and Norflex have counseled the patient to take 3 days of anti-inflammatory muscle relaxer to see if this significantly improves the symptoms.  He is counseled to follow-up with the PCP for reevaluation and further diagnostic imaging if indicated.  Return precautions reviewed.  Final Clinical  Impression(s) / ED Diagnoses Final diagnoses:  Flank pain    Rx / DC Orders ED Discharge Orders         Ordered    naproxen (NAPROSYN) 375 MG tablet  2 times daily     05/16/19 1058    orphenadrine (NORFLEX) 100 MG tablet  2 times daily     05/16/19 1058           Arby Barrette, MD 05/16/19 1108

## 2019-05-16 NOTE — ED Notes (Signed)
ED Provider at bedside. 

## 2019-05-16 NOTE — Discharge Instructions (Signed)
1.  At this time the exact cause of your flank pain has not been identified.  You do not have any kidney stones or urinary tract infection.  A noncontrast CT scan does not identify other structural problems.  Your pain appears likely to be due to muscular strain.  Take naproxen twice daily and Norflex twice daily as prescribed for the next 3 to 5 days.  If your symptoms resolve completely, this is most likely musculoskeletal.  If you continue to have pain, new symptoms or other concerning findings, you will need further evaluation.  Schedule a follow-up appointment with the family doctor for recheck within the next 5 to 10 days. 2.  Return to the emergency department immediately if you develop fevers, suddenly worsening pain, vomiting or other concerning symptoms.

## 2019-07-02 ENCOUNTER — Other Ambulatory Visit: Payer: Self-pay

## 2019-07-02 ENCOUNTER — Encounter (HOSPITAL_COMMUNITY): Payer: Self-pay | Admitting: Emergency Medicine

## 2019-07-02 ENCOUNTER — Ambulatory Visit (HOSPITAL_COMMUNITY)
Admission: EM | Admit: 2019-07-02 | Discharge: 2019-07-02 | Disposition: A | Discharge status: Home / Self Care | Payer: Medicaid Other | Attending: Family Medicine | Admitting: Family Medicine

## 2019-07-02 DIAGNOSIS — M659 Synovitis and tenosynovitis, unspecified: Secondary | ICD-10-CM

## 2019-07-02 MED ORDER — METHYLPREDNISOLONE 4 MG PO TBPK: ORAL_TABLET | ORAL | 0 refills | Status: AC

## 2019-07-02 NOTE — Discharge Instructions (Signed)
Take the Medrol Dosepak as directed This is a steroid anti-inflammatory Take all of day 1 today Use ice to reduce pain and swelling Limit use of hand until pain improves Take ibuprofen 600 mg-800 mg with food as needed for pain

## 2019-07-02 NOTE — ED Triage Notes (Signed)
Left hand fourth finger swollen and sore for 2 months. He had similar experience in right hand a few months ago, it resolved spontaneously. No injuries. Problems with grip in right hand.

## 2019-07-05 NOTE — ED Provider Notes (Signed)
[facility]    CSN: [id number] Arrival date & time: 07/02/19  1201      History   Chief Complaint Chief Complaint  Patient presents with  . Hand Pain    HPI Jacob Church is a 44 y.o. male.   HPI  Patient has pain and swelling localized to the fourth finger of his left hand.  He states he has had similar problems with his right hand but it only lasted few days when it went away.  He is not having any triggering or locking of the finger.  He does have pain with flexion and extension.  He is holding his hand in a slightly flexed position.  He cannot recall any accident or injury.  He states he does use his hands a lot when he does weight lifting activities.  Past Medical History:  Diagnosis Date  . Asthma   . GERD (gastroesophageal reflux disease)   . Pneumonia hx    Patient Active Problem List   Diagnosis Date Noted  . HNP (herniated nucleus pulposus), lumbar 11/12/2016    Past Surgical History:  Procedure Laterality Date  . BACK SURGERY     numerous  . KNEE SURGERY Bilateral    scope x2 rt. left menicus, acl rt   . LUMBAR LAMINECTOMY/DECOMPRESSION MICRODISCECTOMY Right 11/12/2016   Procedure: Microdiscectomy - right - Lumbar two-Lumbar three  extraforaminal;  Surgeon: Julio Sicks, MD;  Location: [facility] OR;  Service: Neurosurgery;  Laterality: Right;       Home Medications    Prior to Admission medications   Medication Sig Start Date End Date Taking? Authorizing Provider  Multiple Vitamin (MULTIVITAMIN WITH MINERALS) TABS Take 1 tablet by mouth daily.   Yes [provider]  methylPREDNISolone (MEDROL DOSEPAK) 4 MG TBPK tablet TAD 07/02/19   Eustace Moore, MD  naproxen (NAPROSYN) 375 MG tablet Take 1 tablet (375 mg total) by mouth 2 (two) times daily. 05/16/19   Arby Barrette, MD  orphenadrine (NORFLEX) 100 MG tablet Take 1 tablet (100 mg total) by mouth 2 (two) times daily. 05/16/19   Arby Barrette, MD  albuterol (PROVENTIL HFA;VENTOLIN HFA)  108 (90 Base) MCG/ACT inhaler Inhale 1-2 puffs into the lungs every 6 (six) hours as needed for wheezing or shortness of breath. Patient not taking: Reported on 12/14/2017 05/01/17 07/02/19  Emi Holes, PA-C    Family History No family history on file.  Social History Social History   Tobacco Use  . Smoking status: Never Smoker  . Smokeless tobacco: Never Used  Substance Use Topics  . Alcohol use: Yes    Comment: occ  . Drug use: Yes    Types: Marijuana    Comment: 4 times a week     Allergies   Morphine and related and Shellfish allergy   Review of Systems Review of Systems  Musculoskeletal: Positive for arthralgias.       Left hand pain     Physical Exam Triage Vital Signs ED Triage Vitals  Enc Vitals Group     BP 07/02/19 1310 (!) 135/91     Pulse Rate 07/02/19 1310 75     Resp 07/02/19 1310 16     Temp 07/02/19 1310 98.4 F (36.9 C)     Temp Source 07/02/19 1310 Oral     SpO2 07/02/19 1310 97 %     Weight --      Height --      Head Circumference --  Peak Flow --      Pain Score 07/02/19 1307 8     Pain Loc --      Pain Edu? --      Excl. in [place]? --    No data found.  Updated Vital Signs BP (!) 135/91   Pulse 75   Temp 98.4 F (36.9 C) (Oral)   Resp 16   SpO2 97%       Physical Exam Constitutional:      General: He is not in acute distress.    Appearance: Normal appearance. He is well-developed and normal weight.     Comments: Uncomfortable hand, athletic muscular appearance  HENT:     Head: Normocephalic and atraumatic.     Mouth/Throat:     Comments: Mask in place Eyes:     Conjunctiva/sclera: Conjunctivae normal.     Pupils: Pupils are equal, round, and reactive to light.  Cardiovascular:     Rate and Rhythm: Normal rate.  Pulmonary:     Effort: Pulmonary effort is normal. No respiratory distress.  Abdominal:     General: There is no distension.     Palpations: Abdomen is soft.  Musculoskeletal:        General: Normal  range of motion.     Cervical back: Normal range of motion.     Comments: Both hands are observed.  Left hand has a swollen fourth finger diffusely.  There is no tenderness over the dorsal aspect.  The palmar aspect is tender especially at the MCP joint/pulley area.  Tenderness over the flexor tendon.  Pain with any flexion of the finger, extreme with resistance to flexion.  Extension is normal.  Skin:    General: Skin is warm and dry.  Neurological:     Mental Status: He is alert.     Sensory: No sensory deficit.     Motor: No weakness.  Psychiatric:        Mood and Affect: Mood normal.        Behavior: Behavior normal.      UC Treatments / Results  Labs (all labs ordered are listed, but only abnormal results are displayed) Labs Reviewed - No data to display  EKG   Radiology No results found.  Procedures Procedures (including critical care time)  Medications Ordered in UC Medications - No data to display  Initial Impression / Assessment and Plan / UC Course  I have reviewed the triage vital signs and the nursing notes.  Pertinent labs & imaging results that were available during my care of the patient were reviewed by me and considered in my medical decision making (see chart for details).     Physical exam finding is consistent with flexor tenosynovitis of the fourth finger. Final Clinical Impressions(s) / UC Diagnoses   Final diagnoses:  Flexor tenosynovitis of finger     Discharge Instructions     Take the Medrol Dosepak as directed This is a steroid anti-inflammatory Take all of day 1 today Use ice to reduce pain and swelling Limit use of hand until pain improves Take ibuprofen 600 mg-800 mg with food as needed for pain   ED Prescriptions    Medication Sig Dispense Auth. Provider   methylPREDNISolone (MEDROL DOSEPAK) 4 MG TBPK tablet TAD 21 tablet Raylene Everts, MD     PDMP not reviewed this encounter.   Raylene Everts, MD 07/05/19 [phone]

## 2019-09-14 ENCOUNTER — Other Ambulatory Visit: Payer: Self-pay

## 2019-09-14 ENCOUNTER — Emergency Department (HOSPITAL_BASED_OUTPATIENT_CLINIC_OR_DEPARTMENT_OTHER)
Admission: EM | Admit: 2019-09-14 | Discharge: 2019-09-14 | Disposition: A | Discharge status: Home / Self Care | Payer: Medicaid Other | Attending: Emergency Medicine | Admitting: Emergency Medicine

## 2019-09-14 ENCOUNTER — Encounter (HOSPITAL_BASED_OUTPATIENT_CLINIC_OR_DEPARTMENT_OTHER): Payer: Self-pay

## 2019-09-14 DIAGNOSIS — S61012A Laceration without foreign body of left thumb without damage to nail, initial encounter: Secondary | ICD-10-CM

## 2019-09-14 DIAGNOSIS — S61011A Laceration without foreign body of right thumb without damage to nail, initial encounter: Secondary | ICD-10-CM | POA: Insufficient documentation

## 2019-09-14 DIAGNOSIS — J45909 Unspecified asthma, uncomplicated: Secondary | ICD-10-CM | POA: Insufficient documentation

## 2019-09-14 DIAGNOSIS — Y93G1 Activity, food preparation and clean up: Secondary | ICD-10-CM | POA: Insufficient documentation

## 2019-09-14 DIAGNOSIS — W260XXA Contact with knife, initial encounter: Secondary | ICD-10-CM | POA: Diagnosis not present

## 2019-09-14 DIAGNOSIS — Y92008 Other place in unspecified non-institutional (private) residence as the place of occurrence of the external cause: Secondary | ICD-10-CM | POA: Insufficient documentation

## 2019-09-14 DIAGNOSIS — Z79899 Other long term (current) drug therapy: Secondary | ICD-10-CM | POA: Insufficient documentation

## 2019-09-14 DIAGNOSIS — Y999 Unspecified external cause status: Secondary | ICD-10-CM | POA: Insufficient documentation

## 2019-09-14 MED ORDER — LIDOCAINE HCL (PF) 1 % IJ SOLN
5.0000 mL | Freq: Once | INTRAMUSCULAR | Status: AC
  Administered 2019-09-14: 5 mL
  Filled 2019-09-14: qty 5

## 2019-09-14 NOTE — ED Triage Notes (Signed)
Pt states he cut left thumb with a knife while cutting ribs ~45 min PTA-semicircular lac noted to thumb tip-NAD-steady gait

## 2019-09-14 NOTE — Discharge Instructions (Signed)
Have the stitches taken out in around 10 days. ?

## 2019-09-14 NOTE — ED Provider Notes (Signed)
[facility] EMERGENCY DEPARTMENT Provider Note   CSN: [id number] Arrival date & time: 09/14/19  1858     History Chief Complaint  Patient presents with  . Finger Injury    Jacob Church is a 44 y.o. male. Patient was cutting ribs prior to arrival when he cut his left thumb with a knife.  No other injury.  Bleeding now controlled but states that it bled a fair amount at home.  Not on blood thinners.  Last tetanus 8 years ago.  Otherwise healthy. HPI     Past Medical History:  Diagnosis Date  . Asthma   . GERD (gastroesophageal reflux disease)   . Pneumonia hx    Patient Active Problem List   Diagnosis Date Noted  . HNP (herniated nucleus pulposus), lumbar 11/12/2016    Past Surgical History:  Procedure Laterality Date  . BACK SURGERY     numerous  . KNEE SURGERY Bilateral    scope x2 rt. left menicus, acl rt   . LUMBAR LAMINECTOMY/DECOMPRESSION MICRODISCECTOMY Right 11/12/2016   Procedure: Microdiscectomy - right - Lumbar two-Lumbar three  extraforaminal;  Surgeon: Julio Sicks, MD;  Location: [facility] OR;  Service: Neurosurgery;  Laterality: Right;       No family history on file.  Social History   Tobacco Use  . Smoking status: Never Smoker  . Smokeless tobacco: Never Used  Substance Use Topics  . Alcohol use: Yes    Comment: occ  . Drug use: Yes    Types: Marijuana    Comment: 4 times a week    Home Medications Prior to Admission medications   Medication Sig Start Date End Date Taking? Authorizing Provider  methylPREDNISolone (MEDROL DOSEPAK) 4 MG TBPK tablet TAD 07/02/19   Eustace Moore, MD  Multiple Vitamin (MULTIVITAMIN WITH MINERALS) TABS Take 1 tablet by mouth daily.    [provider]  naproxen (NAPROSYN) 375 MG tablet Take 1 tablet (375 mg total) by mouth 2 (two) times daily. 05/16/19   Arby Barrette, MD  orphenadrine (NORFLEX) 100 MG tablet Take 1 tablet (100 mg total) by mouth 2 (two) times daily. 05/16/19   Arby Barrette,  MD  albuterol (PROVENTIL HFA;VENTOLIN HFA) 108 (90 Base) MCG/ACT inhaler Inhale 1-2 puffs into the lungs every 6 (six) hours as needed for wheezing or shortness of breath. Patient not taking: Reported on 12/14/2017 05/01/17 07/02/19  Emi Holes, PA-C    Allergies    Morphine and related and Shellfish allergy  Review of Systems   Review of Systems  Constitutional: Negative for appetite change and fever.  Musculoskeletal:       Left thumb laceration.  Skin: Positive for wound.  Neurological: Negative for weakness and numbness.    Physical Exam Updated Vital Signs BP 114/65 (BP Location: Left Arm)   Pulse (!) 112   Temp 99 F (37.2 C) (Oral)   Resp 18   Ht 5\' 8"  (1.727 m)   Wt 83 kg   SpO2 98%   BMI 27.83 kg/m   Physical Exam Vitals and nursing note reviewed.  Cardiovascular:     Rate and Rhythm: Regular rhythm.  Musculoskeletal:     Comments: Laceration along tip of left thumb.  Is approximately 1 cm long and curves up to the nail but does not involve the nail.  It is a flap.  Sensation intact distally.  Skin:    Capillary Refill: Capillary refill takes less than 2 seconds.  Neurological:     Mental  Status: He is alert.     ED Results / Procedures / Treatments   Labs (all labs ordered are listed, but only abnormal results are displayed) Labs Reviewed - No data to display  EKG None  Radiology No results found.  Procedures .Marland KitchenLaceration Repair  Date/Time: 09/14/2019 9:15 PM Performed by: Davonna Belling, MD Authorized by: Davonna Belling, MD   Consent:    Consent obtained:  Verbal   Consent given by:  Patient   Risks discussed:  Infection, pain, poor cosmetic result, poor wound healing and need for additional repair   Alternatives discussed:  No treatment and delayed treatment Anesthesia (see MAR for exact dosages):    Anesthesia method:  Local infiltration   Local anesthetic:  Lidocaine 1% w/o epi Laceration details:    Location:  Finger   Finger  location:  L thumb   Length (cm):  1 Repair type:    Repair type:  Simple Pre-procedure details:    Preparation:  Patient was prepped and draped in usual sterile fashion Exploration:    Hemostasis achieved with:  Direct pressure   Wound exploration: wound explored through full range of motion     Wound extent: no underlying fracture noted     Contaminated: no   Treatment:    Area cleansed with:  Saline   Amount of cleaning:  Standard   Irrigation method:  Syringe Skin repair:    Repair method:  Sutures   Suture size:  6-0   Suture material:  Prolene   Suture technique:  Simple interrupted   Number of sutures:  3 Approximation:    Approximation:  Close Post-procedure details:    Dressing:  Open (no dressing)   Patient tolerance of procedure:  Tolerated well, no immediate complications   (including critical care time)  Medications Ordered in ED Medications  lidocaine (PF) (XYLOCAINE) 1 % injection 5 mL (has no administration in time range)    ED Course  I have reviewed the triage vital signs and the nursing notes.  Pertinent labs & imaging results that were available during my care of the patient were reviewed by me and considered in my medical decision making (see chart for details).    MDM Rules/Calculators/A&P                      Patient with laceration to tip of thumb.  Closed with 3 stitches.  Suture removal in around 10 days.  Tetanus is already up-to-date. Final Clinical Impression(s) / ED Diagnoses Final diagnoses:  Laceration of left thumb without foreign body without damage to nail, initial encounter    Rx / DC Orders ED Discharge Orders    None       Davonna Belling, MD 09/14/19 2116

## 2019-09-16 ENCOUNTER — Emergency Department (HOSPITAL_BASED_OUTPATIENT_CLINIC_OR_DEPARTMENT_OTHER): Payer: Medicaid Other

## 2019-09-16 ENCOUNTER — Other Ambulatory Visit: Payer: Self-pay

## 2019-09-16 ENCOUNTER — Emergency Department (HOSPITAL_BASED_OUTPATIENT_CLINIC_OR_DEPARTMENT_OTHER)
Admission: EM | Admit: 2019-09-16 | Discharge: 2019-09-16 | Disposition: A | Discharge status: Home / Self Care | Payer: Medicaid Other | Attending: Emergency Medicine | Admitting: Emergency Medicine

## 2019-09-16 DIAGNOSIS — Y9389 Activity, other specified: Secondary | ICD-10-CM | POA: Diagnosis not present

## 2019-09-16 DIAGNOSIS — Y999 Unspecified external cause status: Secondary | ICD-10-CM | POA: Diagnosis not present

## 2019-09-16 DIAGNOSIS — Y9289 Other specified places as the place of occurrence of the external cause: Secondary | ICD-10-CM | POA: Diagnosis not present

## 2019-09-16 DIAGNOSIS — R1011 Right upper quadrant pain: Secondary | ICD-10-CM | POA: Diagnosis not present

## 2019-09-16 DIAGNOSIS — W19XXXA Unspecified fall, initial encounter: Secondary | ICD-10-CM

## 2019-09-16 DIAGNOSIS — Z79899 Other long term (current) drug therapy: Secondary | ICD-10-CM | POA: Diagnosis not present

## 2019-09-16 DIAGNOSIS — R109 Unspecified abdominal pain: Secondary | ICD-10-CM

## 2019-09-16 DIAGNOSIS — J45909 Unspecified asthma, uncomplicated: Secondary | ICD-10-CM | POA: Diagnosis not present

## 2019-09-16 DIAGNOSIS — S299XXA Unspecified injury of thorax, initial encounter: Secondary | ICD-10-CM | POA: Diagnosis present

## 2019-09-16 DIAGNOSIS — W01198A Fall on same level from slipping, tripping and stumbling with subsequent striking against other object, initial encounter: Secondary | ICD-10-CM | POA: Insufficient documentation

## 2019-09-16 LAB — COMPREHENSIVE METABOLIC PANEL
ALT: 29 U/L (ref 0–44)
AST: 32 U/L (ref 15–41)
Albumin: 3.9 g/dL (ref 3.5–5.0)
Alkaline Phosphatase: 57 U/L (ref 38–126)
Anion gap: 10 (ref 5–15)
BUN: 17 mg/dL (ref 6–20)
CO2: 26 mmol/L (ref 22–32)
Calcium: 9 mg/dL (ref 8.9–10.3)
Chloride: 104 mmol/L (ref 98–111)
Creatinine, Ser: 1.37 mg/dL — ABNORMAL HIGH (ref 0.61–1.24)
GFR calc Af Amer: >60 mL/min (ref >60)
GFR calc non Af Amer: >60 mL/min (ref >60)
Glucose, Bld: 90 mg/dL (ref 70–99)
Potassium: 4.1 mmol/L (ref 3.5–5.1)
Sodium: 140 mmol/L (ref 135–145)
Total Bilirubin: 0.5 mg/dL (ref 0.3–1.2)
Total Protein: 6.6 g/dL (ref 6.5–8.1)

## 2019-09-16 LAB — URINALYSIS, ROUTINE W REFLEX MICROSCOPIC
Bilirubin Urine: NEGATIVE
Glucose, UA: NEGATIVE mg/dL
Hgb urine dipstick: NEGATIVE
Ketones, ur: NEGATIVE mg/dL
Leukocytes,Ua: NEGATIVE
Nitrite: NEGATIVE
Protein, ur: NEGATIVE mg/dL
Specific Gravity, Urine: >1.03 — ABNORMAL HIGH (ref 1.005–1.030)
pH: 6 (ref 5.0–8.0)

## 2019-09-16 LAB — CBC WITH DIFFERENTIAL/PLATELET
Abs Immature Granulocytes: 0.03 10*3/uL (ref 0.00–0.07)
Basophils Absolute: 0 10*3/uL (ref 0.0–0.1)
Basophils Relative: 1 %
Eosinophils Absolute: 0.3 10*3/uL (ref 0.0–0.5)
Eosinophils Relative: 6 %
HCT: 45.9 % (ref 39.0–52.0)
Hemoglobin: 15.8 g/dL (ref 13.0–17.0)
Immature Granulocytes: 1 %
Lymphocytes Relative: 25 %
Lymphs Abs: 1.6 10*3/uL (ref 0.7–4.0)
MCH: 31.5 pg (ref 26.0–34.0)
MCHC: 34.4 g/dL (ref 30.0–36.0)
MCV: 91.4 fL (ref 80.0–100.0)
Monocytes Absolute: 0.6 10*3/uL (ref 0.1–1.0)
Monocytes Relative: 9 %
Neutro Abs: 3.6 10*3/uL (ref 1.7–7.7)
Neutrophils Relative %: 58 %
Platelets: 240 10*3/uL (ref 150–400)
RBC: 5.02 MIL/uL (ref 4.22–5.81)
RDW: 12.9 % (ref 11.5–15.5)
WBC: 6.2 10*3/uL (ref 4.0–10.5)
nRBC: 0 % (ref 0.0–0.2)

## 2019-09-16 MED ORDER — IOHEXOL 300 MG/ML  SOLN
100.0000 mL | Freq: Once | INTRAMUSCULAR | Status: AC | PRN
  Administered 2019-09-16: 100 mL via INTRAVENOUS

## 2019-09-16 MED ORDER — FENTANYL CITRATE (PF) 100 MCG/2ML IJ SOLN
50.0000 ug | Freq: Once | INTRAMUSCULAR | Status: AC
  Administered 2019-09-16: 50 ug via INTRAVENOUS
  Filled 2019-09-16: qty 2

## 2019-09-16 MED ORDER — KETOROLAC TROMETHAMINE 15 MG/ML IJ SOLN
15.0000 mg | Freq: Once | INTRAMUSCULAR | Status: AC
  Administered 2019-09-16: 15 mg via INTRAVENOUS
  Filled 2019-09-16: qty 1

## 2019-09-16 NOTE — Discharge Instructions (Addendum)
Take Tylenol Motrin as needed for pain control.  Return to ER if you develop difficulty in breathing, vomiting, other new concerning symptom.

## 2019-09-16 NOTE — ED Triage Notes (Signed)
Tripped and fell Friday. He hit his R ribs on the runner on his truck.

## 2019-09-16 NOTE — ED Notes (Signed)
Pt discharged to home. Discharge instructions have been discussed with patient and/or family members. Pt verbally acknowledges understanding d/c instructions, and endorses comprehension to checkout at registration before leaving.  °

## 2019-09-17 NOTE — ED Provider Notes (Signed)
[facility] EM[facility] DEPARTMENT Provider Note   CSN: [id number] Arrival date & time: 09/16/19  1153     History Chief Complaint  Patient presents with  . rib pain    Jacob Church is a 44 y.o. male.  Presented to ER with concern for fall, right flank pain, right-sided chest wall pain.  States that he fell on the side of something on his truck and landed on his right side.  States that he has been having relatively severe pain ever since, sharp, stabbing, worse with certain movements, no alleviating factors.  Denies hitting his head, no LOC.  Has not had difficulty in breathing since.  Denies any chronic medical conditions.  HPI     Past Medical History:  Diagnosis Date  . Asthma   . GERD (gastroesophageal reflux disease)   . Pneumonia hx    Patient Active Problem List   Diagnosis Date Noted  . HNP (herniated nucleus pulposus), lumbar 11/12/2016    Past Surgical History:  Procedure Laterality Date  . BACK SURGERY     numerous  . KNEE SURGERY Bilateral    scope x2 rt. left menicus, acl rt   . LUMBAR LAMINECTOMY/DECOMPRESSION MICRODISCECTOMY Right 11/12/2016   Procedure: Microdiscectomy - right - Lumbar two-Lumbar three  extraforaminal;  Surgeon: Earnie Larsson, MD;  Location: [facility];  Service: Neurosurgery;  Laterality: Right;       No family history on file.  Social History   Tobacco Use  . Smoking status: Never Smoker  . Smokeless tobacco: Never Used  Substance Use Topics  . Alcohol use: Yes    Comment: occ  . Drug use: Yes    Types: Marijuana    Comment: 4 times a week    Home Medications Prior to Admission medications   Medication Sig Start Date End Date Taking? Authorizing Provider  methylPREDNISolone (MEDROL DOSEPAK) 4 MG TBPK tablet TAD 07/02/19   Raylene Everts, MD  Multiple Vitamin (MULTIVITAMIN WITH MINERALS) TABS Take 1 tablet by mouth daily.    [provider]  naproxen (NAPROSYN) 375 MG tablet Take 1 tablet (375 mg total)  by mouth 2 (two) times daily. 05/16/19   Charlesetta Shanks, MD  orphenadrine (NORFLEX) 100 MG tablet Take 1 tablet (100 mg total) by mouth 2 (two) times daily. 05/16/19   Charlesetta Shanks, MD  pantoprazole (PROTONIX) 40 MG tablet TAKE 1 TABLET BY MOUTH ONCE DAILY FOR 10 DAYS THEN AS NEEDED 06/19/19   [provider]  sildenafil (REVATIO) 20 MG tablet TAKE 1 5 TABLETS BY MOUTH 30 MINUTES BEFORE NEEDED. MAX 5 TABLETS IN 24 HOURS. 08/09/19   [provider]  albuterol (PROVENTIL HFA;VENTOLIN HFA) 108 (90 Base) MCG/ACT inhaler Inhale 1-2 puffs into the lungs every 6 (six) hours as needed for wheezing or shortness of breath. Patient not taking: Reported on 12/14/2017 05/01/17 07/02/19  Frederica Kuster, PA-C    Allergies    Morphine and related and Shellfish allergy  Review of Systems   Review of Systems  Constitutional: Negative for chills and fever.  HENT: Negative for ear pain and sore throat.   Eyes: Negative for pain and visual disturbance.  Respiratory: Negative for cough and shortness of breath.   Cardiovascular: Positive for chest pain. Negative for palpitations.  Gastrointestinal: Negative for abdominal pain and vomiting.  Genitourinary: Positive for flank pain. Negative for dysuria and hematuria.  Musculoskeletal: Negative for arthralgias and back pain.  Skin: Negative for color change and rash.  Neurological: Negative  for seizures and syncope.  All other systems reviewed and are negative.   Physical Exam Updated Vital Signs BP 126/82 (BP Location: Left Arm)   Pulse 73   Temp 98.4 F (36.9 C) (Oral)   Resp 18   Ht 5\' 8"  (1.727 m)   Wt 82.6 kg   SpO2 100%   BMI 27.67 kg/m   Physical Exam Vitals and nursing note reviewed.  Constitutional:      Appearance: He is well-developed.  HENT:     Head: Normocephalic and atraumatic.  Eyes:     Conjunctiva/sclera: Conjunctivae normal.  Cardiovascular:     Rate and Rhythm: Normal rate and regular rhythm.     Heart  sounds: No murmur.  Pulmonary:     Effort: Pulmonary effort is normal. No respiratory distress.     Breath sounds: Normal breath sounds.     Comments: TTP over right lateral chest wall, no crepitus, no deformity Abdominal:     Palpations: Abdomen is soft.     Tenderness: There is no abdominal tenderness.     Comments: R flank TTP  Musculoskeletal:     Cervical back: Neck supple.     Comments: No T, L spine midline TTP No TTP throughout all four extremities, amb without difficulty  Skin:    General: Skin is warm and dry.     Capillary Refill: Capillary refill takes less than 2 seconds.  Neurological:     Mental Status: He is alert.     ED Results / Procedures / Treatments   Labs (all labs ordered are listed, but only abnormal results are displayed) Labs Reviewed  URINALYSIS, ROUTINE W REFLEX MICROSCOPIC - Abnormal; Notable for the following components:      Result Value   Specific Gravity, Urine >1.030 (*)    All other components within normal limits  COMPREHENSIVE METABOLIC PANEL - Abnormal; Notable for the following components:   Creatinine, Ser 1.37 (*)    All other components within normal limits  CBC WITH DIFFERENTIAL/PLATELET    EKG None  Radiology DG Ribs Unilateral W/Chest Right  Result Date: 09/16/2019 CLINICAL DATA:  Fall.  Rib pain. EXAM: RIGHT RIBS AND CHEST - 3+ VIEW COMPARISON:  None. FINDINGS: No fracture or other bone lesions are seen involving the ribs. There is no evidence of pneumothorax or pleural effusion. Both lungs are clear. Heart size and mediastinal contours are within normal limits. IMPRESSION: Negative. Electronically Signed   By: 05-22-1972 III M.D   On: 09/16/2019 12:43   CT ABDOMEN PELVIS W CONTRAST  Result Date: 09/16/2019 CLINICAL DATA:  Right flank pain post abdominal trauma. EXAM: CT ABDOMEN AND PELVIS WITH CONTRAST TECHNIQUE: Multidetector CT imaging of the abdomen and pelvis was performed using the standard protocol following bolus  administration of intravenous contrast. CONTRAST:  09/18/2019 OMNIPAQUE IOHEXOL 300 MG/ML  SOLN COMPARISON:  May 24, 2019 FINDINGS: Lower chest: No acute abnormality. Hepatobiliary: No hepatic injury or perihepatic hematoma. Gallbladder is unremarkable Pancreas: Unremarkable. No pancreatic ductal dilatation or surrounding inflammatory changes. Spleen: Normal in size without focal abnormality. Adrenals/Urinary Tract: No adrenal hemorrhage or renal injury identified. Bladder is unremarkable. Stomach/Bowel: Stomach is within normal limits. Appendix appears normal. No evidence of bowel wall thickening, distention, or inflammatory changes. Vascular/Lymphatic: No significant vascular findings are present. No enlarged abdominal or pelvic lymph nodes. Reproductive: Prostate is unremarkable. Other: No abdominal wall hernia or abnormality. No abdominopelvic ascites. Musculoskeletal: No fracture is seen. L2-L3 and L3-L4 right-sided bridging osteophytes. IMPRESSION: No evidence  of acute traumatic injury to the abdomen or pelvis. Electronically Signed   By: Ted Mcalpine M.D.   On: 09/16/2019 14:51    Procedures Procedures (including critical care time)  Medications Ordered in ED Medications  ketorolac (TORADOL) 15 MG/ML injection 15 mg (15 mg Intravenous Given 09/16/19 1319)  fentaNYL (SUBLIMAZE) injection 50 mcg (50 mcg Intravenous Given 09/16/19 1320)  iohexol (OMNIPAQUE) 300 MG/ML solution 100 mL (100 mLs Intravenous Contrast Given 09/16/19 1428)    ED Course  I have reviewed the triage vital signs and the nursing notes.  Pertinent labs & imaging results that were available during my care of the patient were reviewed by me and considered in my medical decision making (see chart for details).    MDM Rules/Calculators/A&P                      44 year old male presents to ER after fall, concern for right sided flank pain, right-sided chest wall pain.  Did note fairly significant tenderness on exam to  right flank, right lateral chest wall.  Raising concern for possibility of rib fractures, hepatic, renal, retroperitoneal hematoma.  Patient otherwise was well-appearing with stable vital signs.  CXR negative for rib fractures.  CT abdomen pelvis was negative for any acute abdominal pelvic injury.  Suspect most likely MSK strain.  Discharged home.  Recommended Tylenol, NSAIDs.    After the discussed management above, the patient was determined to be safe for discharge.  The patient was in agreement with this plan and all questions regarding their care were answered.  ED return precautions were discussed and the patient will return to the ED with any significant worsening of condition.   Final Clinical Impression(s) / ED Diagnoses Final diagnoses:  Right flank pain  Fall, initial encounter    Rx / DC Orders ED Discharge Orders    None       Milagros Loll, MD 09/17/19 1504

## 2019-09-18 ENCOUNTER — Other Ambulatory Visit: Payer: Self-pay

## 2019-09-18 ENCOUNTER — Encounter (HOSPITAL_COMMUNITY): Payer: Self-pay

## 2019-09-18 ENCOUNTER — Ambulatory Visit (HOSPITAL_COMMUNITY)
Admission: EM | Admit: 2019-09-18 | Discharge: 2019-09-18 | Disposition: A | Discharge status: Home / Self Care | Payer: Medicaid Other

## 2019-09-18 DIAGNOSIS — S61012D Laceration without foreign body of left thumb without damage to nail, subsequent encounter: Secondary | ICD-10-CM | POA: Diagnosis not present

## 2019-09-18 DIAGNOSIS — T8131XA Disruption of external operation (surgical) wound, not elsewhere classified, initial encounter: Secondary | ICD-10-CM | POA: Diagnosis not present

## 2019-09-18 NOTE — ED Triage Notes (Signed)
Pt presents for follow up with left thumb; pt states he got sutures put in a few days ago that came out on their own before it was time.

## 2019-09-18 NOTE — Discharge Instructions (Signed)
Continue to use the steri-strips and bandage the thumb   This should heal well, you can start to clean the wound again in about 2-3 days, and replace the steri-strips or use regular bandaging if healing nicely  Keep the wound covered with gloves throughout the day to aid in steri-strip integrity  Return if wound completely opens or signs of infection

## 2019-09-18 NOTE — ED Provider Notes (Signed)
[facility]    CSN: [id number] Arrival date & time: 09/18/19  1216      History   Chief Complaint Chief Complaint  Patient presents with  . Follow-up    HPI Jacob Church is a 44 y.o. male.   Patient presents for evaluation of sutures in the left thumb.  He reports he had thumb laceration on 09/14/2019 and had 3 sutures placed in laceration.  He reports he kept the wound covered for a few days and then when he went to take the bandage off he noticed that the sutures become untied and came out.  He reports a little bit of bleeding this occurred.  He reports he comes in today for evaluation of wound.  Denies significant pain or spreading redness or discharge.     Past Medical History:  Diagnosis Date  . Asthma   . GERD (gastroesophageal reflux disease)   . Pneumonia hx    Patient Active Problem List   Diagnosis Date Noted  . HNP (herniated nucleus pulposus), lumbar 11/12/2016    Past Surgical History:  Procedure Laterality Date  . BACK SURGERY     numerous  . KNEE SURGERY Bilateral    scope x2 rt. left menicus, acl rt   . LUMBAR LAMINECTOMY/DECOMPRESSION MICRODISCECTOMY Right 11/12/2016   Procedure: Microdiscectomy - right - Lumbar two-Lumbar three  extraforaminal;  Surgeon: Julio Sicks, MD;  Location: [facility] OR;  Service: Neurosurgery;  Laterality: Right;       Home Medications    Prior to Admission medications   Medication Sig Start Date End Date Taking? Authorizing Provider  methylPREDNISolone (MEDROL DOSEPAK) 4 MG TBPK tablet TAD 07/02/19   Eustace Moore, MD  Multiple Vitamin (MULTIVITAMIN WITH MINERALS) TABS Take 1 tablet by mouth daily.    [provider]  naproxen (NAPROSYN) 375 MG tablet Take 1 tablet (375 mg total) by mouth 2 (two) times daily. 05/16/19   Arby Barrette, MD  orphenadrine (NORFLEX) 100 MG tablet Take 1 tablet (100 mg total) by mouth 2 (two) times daily. 05/16/19   Arby Barrette, MD  pantoprazole (PROTONIX) 40 MG  tablet TAKE 1 TABLET BY MOUTH ONCE DAILY FOR 10 DAYS THEN AS NEEDED 06/19/19   [provider]  sildenafil (REVATIO) 20 MG tablet TAKE 1 5 TABLETS BY MOUTH 30 MINUTES BEFORE NEEDED. MAX 5 TABLETS IN 24 HOURS. 08/09/19   [provider]  albuterol (PROVENTIL HFA;VENTOLIN HFA) 108 (90 Base) MCG/ACT inhaler Inhale 1-2 puffs into the lungs every 6 (six) hours as needed for wheezing or shortness of breath. Patient not taking: Reported on 12/14/2017 05/01/17 07/02/19  Emi Holes, PA-C    Family History History reviewed. No pertinent family history.  Social History Social History   Tobacco Use  . Smoking status: Never Smoker  . Smokeless tobacco: Never Used  Substance Use Topics  . Alcohol use: Yes    Comment: occ  . Drug use: Yes    Types: Marijuana    Comment: 4 times a week     Allergies   Morphine and related and Shellfish allergy   Review of Systems Review of Systems   Physical Exam Triage Vital Signs ED Triage Vitals  Enc Vitals Group     BP 09/18/19 1242 135/75     Pulse Rate 09/18/19 1242 68     Resp 09/18/19 1242 16     Temp 09/18/19 1242 98.2 F (36.8 C)     Temp Source 09/18/19 1242 Oral  SpO2 09/18/19 1242 99 %     Weight --      Height --      Head Circumference --      Peak Flow --      Pain Score 09/18/19 1257 5     Pain Loc --      Pain Edu? --      Excl. in [place]? --    No data found.  Updated Vital Signs BP 135/75 (BP Location: Left Arm)   Pulse 68   Temp 98.2 F (36.8 C) (Oral)   Resp 16   SpO2 99%   Visual Acuity Right Eye Distance:   Left Eye Distance:   Bilateral Distance:    Right Eye Near:   Left Eye Near:    Bilateral Near:     Physical Exam Vitals and nursing note reviewed.  Constitutional:      Appearance: Normal appearance.  Musculoskeletal:     Comments: Left thumb with elliptical 1cm laceration with skin flap inplace. There is 1 remaining 6-0 blue suture at the margin of the wound. Appears well healing  and skin flap adhered to tissues beneath . No bleeding.    Neurological:     General: No focal deficit present.     Mental Status: He is alert and oriented to person, place, and time.      UC Treatments / Results  Labs (all labs ordered are listed, but only abnormal results are displayed) Labs Reviewed - No data to display  EKG   Radiology   Procedures Procedures (including critical care time) 1 suture removed and replaced with steri-strips and bandaging Medications Ordered in UC Medications - No data to display  Initial Impression / Assessment and Plan / UC Course  I have reviewed the triage vital signs and the nursing notes.  Pertinent labs & imaging results that were available during my care of the patient were reviewed by me and considered in my medical decision making (see chart for details).     #Laceration left thumb #Suture burst Patient is a 44 year old with recent laceration left thumb.  Sutures been in place for 2 days prior to burst.  Wound appears it is well-healing and flap is secure.  Will remove remaining sutures and Place Steri-Strips as I feel this will heal well without further suturing.  Patient struck to keep it dry for the next 48 hours.  Daily bandage changing.  Return precautions were discussed. Final Clinical Impressions(s) / UC Diagnoses   Final diagnoses:  Laceration of left thumb without foreign body without damage to nail, subsequent encounter  Disruption or dehiscence of closure of skin, initial encounter     Discharge Instructions     Continue to use the steri-strips and bandage the thumb   This should heal well, you can start to clean the wound again in about 2-3 days, and replace the steri-strips or use regular bandaging if healing nicely  Keep the wound covered with gloves throughout the day to aid in steri-strip integrity  Return if wound completely opens or signs of infection     ED Prescriptions    None     PDMP not  reviewed this encounter.   Purnell Shoemaker, PA-C 09/18/19 1344

## 2020-04-30 ENCOUNTER — Other Ambulatory Visit: Payer: Self-pay

## 2020-04-30 ENCOUNTER — Emergency Department (HOSPITAL_BASED_OUTPATIENT_CLINIC_OR_DEPARTMENT_OTHER): Payer: Medicaid Other

## 2020-04-30 ENCOUNTER — Encounter (HOSPITAL_BASED_OUTPATIENT_CLINIC_OR_DEPARTMENT_OTHER): Payer: Self-pay

## 2020-04-30 ENCOUNTER — Emergency Department (HOSPITAL_BASED_OUTPATIENT_CLINIC_OR_DEPARTMENT_OTHER)
Admission: EM | Admit: 2020-04-30 | Discharge: 2020-05-01 | Disposition: A | Discharge status: Home / Self Care | Payer: Medicaid Other | Attending: Emergency Medicine | Admitting: Emergency Medicine

## 2020-04-30 DIAGNOSIS — J209 Acute bronchitis, unspecified: Secondary | ICD-10-CM | POA: Diagnosis not present

## 2020-04-30 DIAGNOSIS — J45909 Unspecified asthma, uncomplicated: Secondary | ICD-10-CM | POA: Diagnosis not present

## 2020-04-30 DIAGNOSIS — R0602 Shortness of breath: Secondary | ICD-10-CM | POA: Diagnosis present

## 2020-04-30 DIAGNOSIS — J40 Bronchitis, not specified as acute or chronic: Secondary | ICD-10-CM

## 2020-04-30 DIAGNOSIS — Z20822 Contact with and (suspected) exposure to covid-19: Secondary | ICD-10-CM | POA: Insufficient documentation

## 2020-04-30 LAB — CBC WITH DIFFERENTIAL/PLATELET
Abs Immature Granulocytes: 0.05 10*3/uL (ref 0.00–0.07)
Basophils Absolute: 0 10*3/uL (ref 0.0–0.1)
Basophils Relative: 0 %
Eosinophils Absolute: 0 10*3/uL (ref 0.0–0.5)
Eosinophils Relative: 0 %
HCT: 47.7 % (ref 39.0–52.0)
Hemoglobin: 16.1 g/dL (ref 13.0–17.0)
Immature Granulocytes: 1 %
Lymphocytes Relative: 21 %
Lymphs Abs: 1.8 10*3/uL (ref 0.7–4.0)
MCH: 30.7 pg (ref 26.0–34.0)
MCHC: 33.8 g/dL (ref 30.0–36.0)
MCV: 91 fL (ref 80.0–100.0)
Monocytes Absolute: 0.9 10*3/uL (ref 0.1–1.0)
Monocytes Relative: 11 %
Neutro Abs: 5.8 10*3/uL (ref 1.7–7.7)
Neutrophils Relative %: 67 %
Platelets: 364 10*3/uL (ref 150–400)
RBC: 5.24 MIL/uL (ref 4.22–5.81)
RDW: 14.4 % (ref 11.5–15.5)
WBC: 8.6 10*3/uL (ref 4.0–10.5)
nRBC: 0 % (ref 0.0–0.2)

## 2020-04-30 LAB — BASIC METABOLIC PANEL
Anion gap: 13 (ref 5–15)
BUN: 11 mg/dL (ref 6–20)
CO2: 23 mmol/L (ref 22–32)
Calcium: 9.2 mg/dL (ref 8.9–10.3)
Chloride: 102 mmol/L (ref 98–111)
Creatinine, Ser: 1.25 mg/dL — ABNORMAL HIGH (ref 0.61–1.24)
GFR, Estimated: >60 mL/min (ref >60)
Glucose, Bld: 82 mg/dL (ref 70–99)
Potassium: 4 mmol/L (ref 3.5–5.1)
Sodium: 138 mmol/L (ref 135–145)

## 2020-04-30 MED ORDER — ALBUTEROL SULFATE HFA 108 (90 BASE) MCG/ACT IN AERS
4.0000 | INHALATION_SPRAY | Freq: Once | RESPIRATORY_TRACT | Status: AC
  Administered 2020-04-30: 4 via RESPIRATORY_TRACT
  Filled 2020-04-30: qty 6.7

## 2020-04-30 NOTE — ED Notes (Addendum)
Patient denies pain and is resting comfortably.  

## 2020-04-30 NOTE — ED Triage Notes (Signed)
Pt reports congestion and shortness of breath for approximantly a week. pts daughter had rsv and pt was tested for covid 3 times since christmas at [facility] with the pcr test.

## 2020-04-30 NOTE — ED Provider Notes (Signed)
[facility] EMERGENCY DEPARTMENT Provider Note   CSN: [id number] Arrival date & time: 04/30/20  1617     History Chief Complaint  Patient presents with  . Shortness of Breath    Jacob Church is a 45 y.o. male with past medical history of asthma, acid reflux that presents emergency department today for congestion, facial pain, headache and cough with shortness of breath for the past 2 weeks.  Patient states that he is at multiple negative Covid PCR tests.  Patient states that he was around his daughter 2 weeks ago who is 46 years old who was recently diagnosed with RSV.  States that since then he has been having a congestion with facial pain.  Has been trying Mucinex and Flonase without any relief.  States that he feels slightly short of breath when he coughs.  Cough is productive, spitting up green mucus.  Denies any sore throat, trouble swallowing, difficulty breathing, trismus, drooling or muffled voice.  Has not had any recent antibiotics or steroids.  Has not been taking anything for this besides the Mucinex and Flonase.  Denies any ear pain.  Denies any chest pain.  States that he is only short of breath when he coughs.  No other complaints at this time.  States that he is generally healthy.  No history of diabetes.  HPI     Past Medical History:  Diagnosis Date  . Asthma   . GERD (gastroesophageal reflux disease)   . Pneumonia hx    Patient Active Problem List   Diagnosis Date Noted  . HNP (herniated nucleus pulposus), lumbar 11/12/2016    Past Surgical History:  Procedure Laterality Date  . BACK SURGERY     numerous  . KNEE SURGERY Bilateral    scope x2 rt. left menicus, acl rt   . LUMBAR LAMINECTOMY/DECOMPRESSION MICRODISCECTOMY Right 11/12/2016   Procedure: Microdiscectomy - right - Lumbar two-Lumbar three  extraforaminal;  Surgeon: Julio Sicks, MD;  Location: [facility] OR;  Service: Neurosurgery;  Laterality: Right;       History reviewed. No pertinent family  history.  Social History   Tobacco Use  . Smoking status: Never Smoker  . Smokeless tobacco: Never Used  Vaping Use  . Vaping Use: Never used  Substance Use Topics  . Alcohol use: Yes    Comment: occ  . Drug use: Yes    Types: Marijuana    Comment: 4 times a week    Home Medications Prior to Admission medications   Medication Sig Start Date End Date Taking? Authorizing Provider  methylPREDNISolone (MEDROL DOSEPAK) 4 MG TBPK tablet TAD 07/02/19   Eustace Moore, MD  Multiple Vitamin (MULTIVITAMIN WITH MINERALS) TABS Take 1 tablet by mouth daily.    [provider]  naproxen (NAPROSYN) 375 MG tablet Take 1 tablet (375 mg total) by mouth 2 (two) times daily. 05/16/19   Arby Barrette, MD  orphenadrine (NORFLEX) 100 MG tablet Take 1 tablet (100 mg total) by mouth 2 (two) times daily. 05/16/19   Arby Barrette, MD  pantoprazole (PROTONIX) 40 MG tablet TAKE 1 TABLET BY MOUTH ONCE DAILY FOR 10 DAYS THEN AS NEEDED 06/19/19   [provider]  sildenafil (REVATIO) 20 MG tablet TAKE 1 5 TABLETS BY MOUTH 30 MINUTES BEFORE NEEDED. MAX 5 TABLETS IN 24 HOURS. 08/09/19   [provider]  albuterol (PROVENTIL HFA;VENTOLIN HFA) 108 (90 Base) MCG/ACT inhaler Inhale 1-2 puffs into the lungs every 6 (six) hours as needed for wheezing or  shortness of breath. Patient not taking: Reported on 12/14/2017 05/01/17 07/02/19  Emi Holes, PA-C    Allergies    Morphine and related and Shellfish allergy  Review of Systems   Review of Systems  Constitutional: Negative for chills, diaphoresis, fatigue and fever.  HENT: Positive for congestion and sinus pain. Negative for ear pain, hearing loss, mouth sores, sore throat and trouble swallowing.   Eyes: Negative for pain and visual disturbance.  Respiratory: Positive for cough and shortness of breath. Negative for wheezing.   Cardiovascular: Negative for chest pain, palpitations and leg swelling.  Gastrointestinal: Negative for  abdominal distention, abdominal pain, diarrhea, nausea and vomiting.  Genitourinary: Negative for difficulty urinating.  Musculoskeletal: Negative for back pain, neck pain and neck stiffness.  Skin: Negative for pallor.  Neurological: Positive for headaches. Negative for dizziness, seizures, speech difficulty, weakness and numbness.  Psychiatric/Behavioral: Negative for confusion.    Physical Exam Updated Vital Signs BP 132/85   Pulse 90   Temp 98.3 F (36.8 C) (Oral)   Resp 19   Ht 5\' 8"  (1.727 m)   Wt 80.3 kg   SpO2 98%   BMI 26.91 kg/m   Physical Exam Constitutional:      General: He is not in acute distress.    Appearance: Normal appearance. He is not ill-appearing, toxic-appearing or diaphoretic.     Comments: Patient without acute respiratory stress.  Patient is sitting comfortably in bed, no tripoding, use of accessory muscles.  Patient is speaking to me in full sentences.  Handling secretions well.  HENT:     Head: Normocephalic and atraumatic.     Jaw: There is normal jaw occlusion. No trismus, swelling or malocclusion.     Salivary Glands: Right salivary gland is not diffusely enlarged or tender. Left salivary gland is not diffusely enlarged or tender.     Comments: No facial tenderness. NO facial swelling, no erythema or warmth to face.     Nose: No congestion or rhinorrhea.     Right Sinus: No maxillary sinus tenderness or frontal sinus tenderness.     Left Sinus: No maxillary sinus tenderness or frontal sinus tenderness.     Mouth/Throat:     Mouth: Mucous membranes are moist. No oral lesions.     Dentition: Normal dentition.     Tongue: No lesions.     Palate: No mass and lesions.     Pharynx: Oropharynx is clear. Uvula midline. No pharyngeal swelling, oropharyngeal exudate, posterior oropharyngeal erythema or uvula swelling.     Tonsils: No tonsillar exudate or tonsillar abscesses. 1+ on the right. 1+ on the left.     Comments: Patient without tonsillar  enlargement or exudate.  No signs of peritonsillar abscess, palate without any tenderness or masses palpated.  No swelling under the tongue, uvula is midline without any inflammation. Eyes:     General: No visual field deficit.       Right eye: No discharge.        Left eye: No discharge.     Extraocular Movements: Extraocular movements intact.     Conjunctiva/sclera: Conjunctivae normal.     Pupils: Pupils are equal, round, and reactive to light.  Cardiovascular:     Rate and Rhythm: Normal rate and regular rhythm.     Pulses: Normal pulses.     Heart sounds: Normal heart sounds. No murmur heard. No friction rub. No gallop.   Pulmonary:     Effort: Pulmonary effort is normal. No respiratory distress.  Breath sounds: Normal breath sounds. No stridor. No wheezing, rhonchi or rales.  Chest:     Chest wall: No tenderness.  Abdominal:     General: Abdomen is flat. Bowel sounds are normal. There is no distension.     Palpations: Abdomen is soft.     Tenderness: There is no abdominal tenderness. There is no right CVA tenderness or left CVA tenderness.  Musculoskeletal:        General: No swelling or tenderness. Normal range of motion.     Cervical back: Normal range of motion. No rigidity or tenderness.     Right lower leg: No edema.     Left lower leg: No edema.  Lymphadenopathy:     Cervical: No cervical adenopathy.  Skin:    General: Skin is warm and dry.     Capillary Refill: Capillary refill takes less than 2 seconds.     Findings: No erythema or rash.  Neurological:     General: No focal deficit present.     Mental Status: He is alert and oriented to person, place, and time.     Cranial Nerves: Cranial nerves are intact. No cranial nerve deficit or facial asymmetry.     Motor: Motor function is intact. No weakness.     Coordination: Coordination is intact.     Gait: Gait is intact. Gait normal.  Psychiatric:        Mood and Affect: Mood normal.     ED Results /  Procedures / Treatments   Labs (all labs ordered are listed, but only abnormal results are displayed) Labs Reviewed  BASIC METABOLIC PANEL - Abnormal; Notable for the following components:      Result Value   Creatinine, Ser 1.25 (*)    All other components within normal limits  SARS CORONAVIRUS 2 (TAT 6-24 HRS)  CBC WITH DIFFERENTIAL/PLATELET    EKG EKG Interpretation  Date/Time:  Wednesday April 30 2020 21:38:05 EST Ventricular Rate:  89 PR Interval:    QRS Duration: 94 QT Interval:  357 QTC Calculation: 435 R Axis:   165 Text Interpretation: Right and left arm electrode reversal, interpretation assumes no reversal Sinus rhythm Probable lateral infarct, age indeterminate Minimal ST elevation, anterior leads Confirmed by Quintella Reichert [phone]) on 04/30/2020 9:40:20 PM   Radiology DG Chest Port 1 View  Result Date: 04/30/2020 CLINICAL DATA:  Shortness of breath. EXAM: PORTABLE CHEST 1 VIEW COMPARISON:  Sep 16, 2019 FINDINGS: The heart size and mediastinal contours are within normal limits. Both lungs are clear. The visualized skeletal structures are unremarkable. IMPRESSION: No active disease. Electronically Signed   By: Virgina Norfolk M.D.   On: 04/30/2020 21:38    Procedures Procedures (including critical care time)  Medications Ordered in ED Medications  albuterol (VENTOLIN HFA) 108 (90 Base) MCG/ACT inhaler 4 puff (4 puffs Inhalation Given 04/30/20 2133)    ED Course  I have reviewed the triage vital signs and the nursing notes.  Pertinent labs & imaging results that were available during my care of the patient were reviewed by me and considered in my medical decision making (see chart for details).    MDM Rules/Calculators/A&P                         ALECXIS BALTZELL is a 45 y.o. male with past medical history of asthma, acid reflux that presents emergency department today for congestion, facial pain, headache and cough with shortness of breath for the  past 2  weeks.  Patient appears well, no respiratory distress.  Satting at 100% on room air, will obtain basic labs at this time including 6 to 24-hour Covid test.  Also give some albuterol since patient does have asthma, no wheezing heard on exam. PERC negative.  BMP CBC unremarkable.  Physical exam benign, no facial tenderness on exam.  Chest x-ray interpreted me without any acute cardiopulmonary disease, EKG interpreted by Dr. Madilyn Hook without any ST elevation or depression.  Upon ambulation patient remained above 98% on room air.  Think patient most likely has bronchitis, shared decision-making about antibiotics at this time, we decided to hold off since patient is not immunocompromised is a young healthy male.  Patient states that he feels much better after albuterol inhaler given.  Ready for discharge, symptomatic treatment discussed.  Patient will follow up with PCP in the next couple of days.  Doubt need for further emergent work up at this time. I explained the diagnosis and have given explicit precautions to return to the ER including for any other new or worsening symptoms. The patient understands and accepts the medical plan as it's been dictated and I have answered their questions. Discharge instructions concerning home care and prescriptions have been given. The patient is STABLE and is discharged to home in good condition.   Final Clinical Impression(s) / ED Diagnoses Final diagnoses:  Bronchitis    Rx / DC Orders ED Discharge Orders    None       Farrel Gordon, PA-C 04/30/20 2348    Tilden Fossa, MD 04/30/20 2350

## 2020-04-30 NOTE — Discharge Instructions (Signed)
You were seen today for an upper respiratory infection. Use Flonase as directed for your congestion and Mucinex/Robitussin for your cough. Continue to stay well-hydrated. Gargle warm salt water and spit it out for sore throat. Take benadryl or other antihistamine to decrease secretions. Continued to alternate between Tylenol and ibuprofen for pain. Followup with your primary care doctor in 5-7 days or referral for urgent care for recheck of ongoing symptoms however return to emergency department for emergent changing or worsening of symptoms- example: shortness of breath, chest pain, difficulty swallowing, muffled voice.  Use the attached instructions, you will get your Covid test in the next 6 to 24 hours.  If this is positive you need to isolate and follow CDC guidelines.  Please use the attached instructions.  Get help right away if: You cough up blood. You have chest pain. You have very bad shortness of breath. You become dehydrated. You faint or keep feeling like you are going to faint. You keep vomiting. You have a very bad headache. Your fever or chills get worse.

## 2020-04-30 NOTE — ED Notes (Signed)
02 sats remained at 98% during ambulation trial.

## 2020-05-01 LAB — SARS CORONAVIRUS 2 (TAT 6-24 HRS): SARS Coronavirus 2: NEGATIVE

## 2020-10-21 ENCOUNTER — Emergency Department (HOSPITAL_COMMUNITY)
Admission: EM | Admit: 2020-10-21 | Discharge: 2020-10-21 | Disposition: A | Discharge status: Home / Self Care | Payer: Medicaid Other | Attending: Emergency Medicine | Admitting: Emergency Medicine

## 2020-10-21 ENCOUNTER — Encounter (HOSPITAL_COMMUNITY): Payer: Self-pay

## 2020-10-21 ENCOUNTER — Emergency Department (HOSPITAL_COMMUNITY): Payer: Medicaid Other

## 2020-10-21 DIAGNOSIS — J45909 Unspecified asthma, uncomplicated: Secondary | ICD-10-CM | POA: Insufficient documentation

## 2020-10-21 DIAGNOSIS — R519 Headache, unspecified: Secondary | ICD-10-CM | POA: Insufficient documentation

## 2020-10-21 MED ORDER — PROCHLORPERAZINE EDISYLATE 10 MG/2ML IJ SOLN
10.0000 mg | Freq: Once | INTRAMUSCULAR | Status: AC
  Administered 2020-10-21: 20:00:00 10 mg via INTRAVENOUS
  Filled 2020-10-21: qty 2

## 2020-10-21 MED ORDER — ACETAMINOPHEN 325 MG PO TABS
650.0000 mg | ORAL_TABLET | Freq: Once | ORAL | Status: AC
  Administered 2020-10-21: 19:00:00 650 mg via ORAL
  Filled 2020-10-21: qty 2

## 2020-10-21 MED ORDER — DIPHENHYDRAMINE HCL 50 MG/ML IJ SOLN
25.0000 mg | Freq: Once | INTRAMUSCULAR | Status: AC
  Administered 2020-10-21: 20:00:00 25 mg via INTRAVENOUS
  Filled 2020-10-21: qty 1

## 2020-10-21 MED ORDER — SODIUM CHLORIDE 0.9 % IV BOLUS
1000.0000 mL | Freq: Once | INTRAVENOUS | Status: AC
  Administered 2020-10-21: 20:00:00 1000 mL via INTRAVENOUS

## 2020-10-21 NOTE — ED Provider Notes (Signed)
[facility]-EMERGENCY DEPT Provider Note   CSN: [id number] Arrival date & time: 10/21/20  1816     History Chief Complaint  Patient presents with   Memory Loss   Headache    Jacob Church is a 45 y.o. male.  The history is provided by the patient.  Headache Pain location:  R parietal Quality:  Dull Radiates to:  Does not radiate Onset quality:  Gradual Timing:  Constant Progression:  Unchanged Chronicity:  New Context: emotional stress   Relieved by:  Nothing Worsened by:  Nothing Associated symptoms: no abdominal pain, no back pain, no blurred vision, no congestion, no cough, no diarrhea, no dizziness, no drainage, no ear pain, no eye pain, no facial pain, no fatigue, no fever, no focal weakness, no hearing loss, no numbness, no seizures, no sore throat, no vomiting and no weakness       Past Medical History:  Diagnosis Date   Asthma    GERD (gastroesophageal reflux disease)    Pneumonia hx    Patient Active Problem List   Diagnosis Date Noted   HNP (herniated nucleus pulposus), lumbar 11/12/2016    Past Surgical History:  Procedure Laterality Date   BACK SURGERY     numerous   KNEE SURGERY Bilateral    scope x2 rt. left menicus, acl rt    LUMBAR LAMINECTOMY/DECOMPRESSION MICRODISCECTOMY Right 11/12/2016   Procedure: Microdiscectomy - right - Lumbar two-Lumbar three  extraforaminal;  Surgeon: Julio Sicks, MD;  Location: [facility] OR;  Service: Neurosurgery;  Laterality: Right;       No family history on file.  Social History   Tobacco Use   Smoking status: Never   Smokeless tobacco: Never  Vaping Use   Vaping Use: Never used  Substance Use Topics   Alcohol use: Yes    Comment: occ   Drug use: Yes    Types: Marijuana    Comment: 4 times a week    Home Medications Prior to Admission medications   Medication Sig Start Date End Date Taking? Authorizing Provider  methylPREDNISolone (MEDROL DOSEPAK) 4 MG TBPK tablet TAD 07/02/19    Eustace Moore, MD  Multiple Vitamin (MULTIVITAMIN WITH MINERALS) TABS Take 1 tablet by mouth daily.    [provider]  naproxen (NAPROSYN) 375 MG tablet Take 1 tablet (375 mg total) by mouth 2 (two) times daily. 05/16/19   Arby Barrette, MD  orphenadrine (NORFLEX) 100 MG tablet Take 1 tablet (100 mg total) by mouth 2 (two) times daily. 05/16/19   Arby Barrette, MD  pantoprazole (PROTONIX) 40 MG tablet TAKE 1 TABLET BY MOUTH ONCE DAILY FOR 10 DAYS THEN AS NEEDED 06/19/19   [provider]  sildenafil (REVATIO) 20 MG tablet TAKE 1 5 TABLETS BY MOUTH 30 MINUTES BEFORE NEEDED. MAX 5 TABLETS IN 24 HOURS. 08/09/19   [provider]  albuterol (PROVENTIL HFA;VENTOLIN HFA) 108 (90 Base) MCG/ACT inhaler Inhale 1-2 puffs into the lungs every 6 (six) hours as needed for wheezing or shortness of breath. Patient not taking: Reported on 12/14/2017 05/01/17 07/02/19  Emi Holes, PA-C    Allergies    Morphine and related and Shellfish allergy  Review of Systems   Review of Systems  Constitutional:  Negative for chills, fatigue and fever.  HENT:  Negative for congestion, ear pain, hearing loss, postnasal drip and sore throat.   Eyes:  Negative for blurred vision, pain and visual disturbance.  Respiratory:  Negative for cough and shortness of breath.  Cardiovascular:  Negative for chest pain and palpitations.  Gastrointestinal:  Negative for abdominal pain, diarrhea and vomiting.  Genitourinary:  Negative for dysuria and hematuria.  Musculoskeletal:  Negative for arthralgias and back pain.  Skin:  Negative for color change and rash.  Neurological:  Positive for headaches. Negative for dizziness, tremors, focal weakness, seizures, syncope, facial asymmetry, speech difficulty, weakness, light-headedness and numbness.  All other systems reviewed and are negative.  Physical Exam Updated Vital Signs BP (!) 143/88   Pulse 78   Temp 98 F (36.7 C) (Oral)   Resp 16   SpO2  100%   Physical Exam Vitals and nursing note reviewed.  Constitutional:      General: He is not in acute distress.    Appearance: He is well-developed. He is not ill-appearing.  HENT:     Head: Normocephalic and atraumatic.  Eyes:     General: No visual field deficit.    Extraocular Movements: Extraocular movements intact.     Conjunctiva/sclera: Conjunctivae normal.     Pupils: Pupils are equal, round, and reactive to light.  Cardiovascular:     Rate and Rhythm: Normal rate and regular rhythm.     Heart sounds: No murmur heard. Pulmonary:     Effort: Pulmonary effort is normal. No respiratory distress.     Breath sounds: Normal breath sounds.  Abdominal:     Palpations: Abdomen is soft.     Tenderness: There is no abdominal tenderness.  Musculoskeletal:        General: Normal range of motion.     Cervical back: Normal range of motion and neck supple.  Skin:    General: Skin is warm and dry.  Neurological:     Mental Status: He is alert and oriented to person, place, and time.     Cranial Nerves: No cranial nerve deficit, dysarthria or facial asymmetry.     Sensory: No sensory deficit.     Motor: No weakness.     Coordination: Coordination normal.  Psychiatric:        Mood and Affect: Mood normal.    ED Results / Procedures / Treatments   Labs (all labs ordered are listed, but only abnormal results are displayed) Labs Reviewed - No data to display  EKG None  Radiology CT Head Wo Contrast  Result Date: 10/21/2020 CLINICAL DATA:  45 year old male with headache and memory loss. EXAM: CT HEAD WITHOUT CONTRAST TECHNIQUE: Contiguous axial images were obtained from the base of the skull through the vertex without intravenous contrast. COMPARISON:  Head CT dated 11/04/2010. FINDINGS: Brain: No evidence of acute infarction, hemorrhage, hydrocephalus, extra-axial collection or mass lesion/mass effect. Vascular: No hyperdense vessel or unexpected calcification. Skull: Normal.  Negative for fracture or focal lesion. Sinuses/Orbits: No acute finding. Other: None. IMPRESSION: Normal noncontrast CT of the brain. Electronically Signed   By: Elgie Collard M.D.   On: 10/21/2020 20:15    Procedures Procedures   Medications Ordered in ED Medications  sodium chloride 0.9 % bolus 1,000 mL (0 mLs Intravenous Stopped 10/21/20 2031)  prochlorperazine (COMPAZINE) injection 10 mg (10 mg Intravenous Given 10/21/20 1930)  diphenhydrAMINE (BENADRYL) injection 25 mg (25 mg Intravenous Given 10/21/20 1930)  acetaminophen (TYLENOL) tablet 650 mg (650 mg Oral Given 10/21/20 1929)    ED Course  I have reviewed the triage vital signs and the nursing notes.  Pertinent labs & imaging results that were available during my care of the patient were reviewed by me and considered in my  medical decision making (see chart for details).    MDM Rules/Calculators/A&P                          ISLAM EICHINGER is a 45 year old male who presents to the ED with headache.  Normal vitals.  No fever.  Patient has been under a lot of stress recently.  Just found out today that a close friend passed away.  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  Neurologically is intact.  He is developed a headache today tension-like headache.  Neurologically he is intact.  Head CT is normal.  No concern for head bleed or infectious process.  Felt better after headache cocktail.  Recommend rest, Tylenol, Motrin.  Discharged in good condition.  Overall tension type headache likely related to stress and poor sleep.  This chart was dictated using voice recognition software.  Despite best efforts to proofread,  errors can occur which can change the documentation meaning.   Final Clinical Impression(s) / ED Diagnoses Final diagnoses:  Bad headache    Rx / DC Orders ED Discharge Orders     None        Virgina Norfolk, DO 10/21/20 2056

## 2020-10-21 NOTE — ED Provider Notes (Signed)
Emergency Medicine Provider Triage Evaluation Note  Jacob Church , a 45 y.o. male  was evaluated in triage.  Pt complains of headache, memory loss.  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  Since then the patient's had a headache, and has been more forgetful.  Patient is to be reminded of the events that occurred this morning.  Has not taken anything for headache.  No prior history of strokes.  Review of Systems  Positive: As above Negative: As above  Physical Exam  BP 134/88 (BP Location: Left Arm)   Pulse 70   Temp 98 F (36.7 C) (Oral)   Resp 18   SpO2 100%  Gen:   Awake, no distress   Resp:  Normal effort  MSK:   Moves extremities without difficulty  Other:  Mental Status:  Alert, thought content appropriate, able to give a coherent history. Speech fluent without evidence of aphasia. Able to follow 2 step commands without difficulty.  Cranial Nerves:  II:  Peripheral visual fields grossly normal, pupils equal, round, reactive to light III,IV, VI: ptosis not present, extra-ocular motions intact bilaterally  V,VII: smile symmetric, facial light touch sensation equal VIII: hearing grossly normal to voice  X: uvula elevates symmetrically  XI: bilateral shoulder shrug symmetric and strong XII: midline tongue extension without fassiculations Motor:  Normal tone. 5/5 strength of BUE and BLE major muscle groups including strong and equal grip strength and dorsiflexion/plantar flexion Sensory: light touch normal in all extremities. Cerebellar: normal finger-to-nose with bilateral upper extremities, Romberg sign absent Gait:  not accessed    Medical Decision Making  Medically screening exam initiated at 6:37 PM.  Appropriate orders placed.  Jacob Church was informed that the remainder of the evaluation will be completed by another provider, this initial triage assessment does not replace that  evaluation, and the importance of remaining in the ED until their evaluation is complete.     Leone Brand 10/21/20 1839    Wynetta Fines, MD 10/22/20 [phone]

## 2020-10-21 NOTE — ED Triage Notes (Signed)
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  Friend was found deceased.  Since Pt has come home, he has felt very sleepy, had a headache, and has to be reminded of events from the morning.  Pain score 9/10.  Denies injury.  Pt has not taken anything for headache.    No neuro deficits noted.

## 2020-12-14 ENCOUNTER — Emergency Department (HOSPITAL_BASED_OUTPATIENT_CLINIC_OR_DEPARTMENT_OTHER)
Admission: EM | Admit: 2020-12-14 | Discharge: 2020-12-14 | Disposition: A | Discharge status: Home / Self Care | Payer: Medicaid Other | Attending: Emergency Medicine | Admitting: Emergency Medicine

## 2020-12-14 ENCOUNTER — Other Ambulatory Visit: Payer: Self-pay

## 2020-12-14 ENCOUNTER — Encounter (HOSPITAL_BASED_OUTPATIENT_CLINIC_OR_DEPARTMENT_OTHER): Payer: Self-pay | Admitting: Emergency Medicine

## 2020-12-14 ENCOUNTER — Emergency Department (HOSPITAL_BASED_OUTPATIENT_CLINIC_OR_DEPARTMENT_OTHER): Payer: Medicaid Other

## 2020-12-14 DIAGNOSIS — W25XXXA Contact with sharp glass, initial encounter: Secondary | ICD-10-CM | POA: Diagnosis not present

## 2020-12-14 DIAGNOSIS — S91312A Laceration without foreign body, left foot, initial encounter: Secondary | ICD-10-CM | POA: Diagnosis not present

## 2020-12-14 DIAGNOSIS — S99922A Unspecified injury of left foot, initial encounter: Secondary | ICD-10-CM | POA: Diagnosis present

## 2020-12-14 DIAGNOSIS — J45909 Unspecified asthma, uncomplicated: Secondary | ICD-10-CM | POA: Diagnosis not present

## 2020-12-14 DIAGNOSIS — Z23 Encounter for immunization: Secondary | ICD-10-CM | POA: Diagnosis not present

## 2020-12-14 MED ORDER — TETANUS-DIPHTH-ACELL PERTUSSIS 5-2.5-18.5 LF-MCG/0.5 IM SUSY
0.5000 mL | PREFILLED_SYRINGE | Freq: Once | INTRAMUSCULAR | Status: AC
  Administered 2020-12-14: 0.5 mL via INTRAMUSCULAR
  Filled 2020-12-14: qty 0.5

## 2020-12-14 MED ORDER — CIPROFLOXACIN HCL 500 MG PO TABS: 500.0000 mg | ORAL_TABLET | Freq: Two times a day (BID) | ORAL | 0 refills | Status: AC

## 2020-12-14 NOTE — ED Triage Notes (Signed)
Pt reports stepped on glass 4 days ago (glass penetrated flip flop); laceration noted to LT heel (no bleeding), pt c/o worsening pain

## 2020-12-14 NOTE — Discharge Instructions (Addendum)
Your tetanus was updated today.  You were prescribed an antibiotic ciprofloxacin.  This is to treat foot infections from foreign body.  Take as prescribed.  This medication can cause tendon rupture so do not do any running or jumping while taking.  Continue basic wound care at home.  You can apply antibiotic ointment such as Neosporin and a bandage while the wound heals.  Follow-up with your primary care doctor for wound check later in the week.  Take Tylenol or ibuprofen for pain at home.

## 2020-12-14 NOTE — ED Provider Notes (Signed)
[facility] EMERGENCY DEPARTMENT Provider Note   CSN: [id number] Arrival date & time: 12/14/20  1537     History Chief Complaint  Patient presents with   Foot Pain   Laceration    Jacob Church is a 45 y.o. male with noncontributory past medical history presenting to emergency department today with chief complaint of left foot pain and laceration happening x4 days ago.  Patient states he has decreased sensation in his feet ever since he multiple back surgeries.  He was walking around the day of symptom onset in his flip-flops and stepped on a piece of glass.  He did not notice it until he felt the blood on his shoe.  He states the piece of glass was stuck in the flip-flop and was easily removed.  He denies any glass breaking.  Taking any over-the-counter medications at home for pain.  He is endorsing mild aching pain localized to the wound.  He rates pain 3 of 10 in severity.  Patient denies any bleeding or purulent drainage.  Denies any fever, chills or other infectious symptoms.  Tdap is not up-to-date.    Past Medical History:  Diagnosis Date   Asthma    GERD (gastroesophageal reflux disease)    Pneumonia hx    Patient Active Problem List   Diagnosis Date Noted   HNP (herniated nucleus pulposus), lumbar 11/12/2016    Past Surgical History:  Procedure Laterality Date   BACK SURGERY     numerous   KNEE SURGERY Bilateral    scope x2 rt. left menicus, acl rt    LUMBAR LAMINECTOMY/DECOMPRESSION MICRODISCECTOMY Right 11/12/2016   Procedure: Microdiscectomy - right - Lumbar two-Lumbar three  extraforaminal;  Surgeon: Julio Sicks, MD;  Location: [facility] OR;  Service: Neurosurgery;  Laterality: Right;       No family history on file.  Social History   Tobacco Use   Smoking status: Never   Smokeless tobacco: Never  Vaping Use   Vaping Use: Never used  Substance Use Topics   Alcohol use: Yes    Comment: occ   Drug use: Yes    Types: Marijuana    Comment: 4 times  a week    Home Medications Prior to Admission medications   Medication Sig Start Date End Date Taking? Authorizing Provider  ciprofloxacin (CIPRO) 500 MG tablet Take 1 tablet (500 mg total) by mouth every 12 (twelve) hours for 7 days. 12/14/20 12/21/20 Yes Shanon Ace, PA-C  methylPREDNISolone (MEDROL DOSEPAK) 4 MG TBPK tablet TAD 07/02/19   Eustace Moore, MD  Multiple Vitamin (MULTIVITAMIN WITH MINERALS) TABS Take 1 tablet by mouth daily.    [provider]  naproxen (NAPROSYN) 375 MG tablet Take 1 tablet (375 mg total) by mouth 2 (two) times daily. 05/16/19   Arby Barrette, MD  orphenadrine (NORFLEX) 100 MG tablet Take 1 tablet (100 mg total) by mouth 2 (two) times daily. 05/16/19   Arby Barrette, MD  pantoprazole (PROTONIX) 40 MG tablet TAKE 1 TABLET BY MOUTH ONCE DAILY FOR 10 DAYS THEN AS NEEDED 06/19/19   [provider]  sildenafil (REVATIO) 20 MG tablet TAKE 1 5 TABLETS BY MOUTH 30 MINUTES BEFORE NEEDED. MAX 5 TABLETS IN 24 HOURS. 08/09/19   [provider]  albuterol (PROVENTIL HFA;VENTOLIN HFA) 108 (90 Base) MCG/ACT inhaler Inhale 1-2 puffs into the lungs every 6 (six) hours as needed for wheezing or shortness of breath. Patient not taking: Reported on 12/14/2017 05/01/17 07/02/19  Emi Holes,  PA-C    Allergies    Morphine and related and Shellfish allergy  Review of Systems   Review of Systems All other systems are reviewed and are negative for acute change except as noted in the HPI.  Physical Exam Updated Vital Signs BP 117/75   Pulse 64   Temp 98.3 F (36.8 C) (Oral)   Resp 18   Ht 5\' 8"  (1.727 m)   Wt 82.1 kg   SpO2 98%   BMI 27.52 kg/m   Physical Exam Vitals and nursing note reviewed.  Constitutional:      Appearance: He is well-developed. He is not ill-appearing or toxic-appearing.  HENT:     Head: Normocephalic and atraumatic.     Nose: Nose normal.  Eyes:     General: No scleral icterus.       Right eye: No  discharge.        Left eye: No discharge.     Conjunctiva/sclera: Conjunctivae normal.  Neck:     Vascular: No JVD.  Cardiovascular:     Rate and Rhythm: Normal rate and regular rhythm.     Pulses: Normal pulses.          Dorsalis pedis pulses are 2+ on the right side and 2+ on the left side.     Heart sounds: Normal heart sounds.  Pulmonary:     Effort: Pulmonary effort is normal.     Breath sounds: Normal breath sounds.  Abdominal:     General: There is no distension.  Musculoskeletal:        General: Normal range of motion.     Cervical back: Normal range of motion.       Feet:     Comments: Compartments in left lower extremity are soft.  Feet:     Comments: 4 cm laceration on heel of left foot as depicted in image above.  No active bleeding.  No purulent drainage.  No foreign body seen or palpated on exam.  There is no surrounding erythema or streaking.  Area is mildly tender to palpation.  Left foot is neurovascularly intact. Skin:    General: Skin is warm and dry.     Capillary Refill: Capillary refill takes less than 2 seconds.     Comments: Equal tactile temperature in all extremities  Neurological:     Mental Status: He is oriented to person, place, and time.     GCS: GCS eye subscore is 4. GCS verbal subscore is 5. GCS motor subscore is 6.     Comments: Fluent speech, no facial droop.  Psychiatric:        Behavior: Behavior normal.    ED Results / Procedures / Treatments   Labs (all labs ordered are listed, but only abnormal results are displayed) Labs Reviewed - No data to display  EKG None  Radiology DG Foot Complete Left  Result Date: 12/14/2020 CLINICAL DATA:  Laceration from glass on heel EXAM: LEFT FOOT - COMPLETE 3+ VIEW COMPARISON:  None. FINDINGS: There is no evidence of fracture or dislocation. There is no evidence of arthropathy or other focal bone abnormality. No radiodense foreign body. Linear lucency in the heel soft tissues consistent with  laceration. IMPRESSION: 1. Linear laceration of the heel, without radiodense foreign body. Electronically Signed   By: 12/16/2020 M.D.   On: 12/14/2020 16:22    Procedures Procedures   Medications Ordered in ED Medications  Tdap (BOOSTRIX) injection 0.5 mL (0.5 mLs Intramuscular Given 12/14/20 1733)  ED Course  I have reviewed the triage vital signs and the nursing notes.  Pertinent labs & imaging results that were available during my care of the patient were reviewed by me and considered in my medical decision making (see chart for details).    MDM Rules/Calculators/A&P                           History provided by patient with additional history obtained from chart review.    Patient presenting with laceration to left heel happening x4 days ago when he stepped on a piece of glass that went through his flip-flop.  Patient is afebrile and well-appearing.  Wound on the bottom of his foot does not appear to be infected.  He is neurovascularly intact.  No active bleeding or purulent drainage.  No foreign body appreciated.  X-ray of left foot without any radiodense foreign body either.  I viewed imaging and agree with radiologist impression.  Offered patient Tylenol for pain, he plans to take it at home. Will put patient on Cipro to cover for Pseudomonas infection given mechanism of injury.  Tetanus updated.  Discussed home wound care with patient.  Recommended follow-up with PCP for wound check.  Strict return precautions discussed.  Patient discharged home in stable condition.  Portions of this note were generated with Scientist, clinical (histocompatibility and immunogenetics). Dictation errors may occur despite best attempts at proofreading.  Final Clinical Impression(s) / ED Diagnoses Final diagnoses:  None    Rx / DC Orders ED Discharge Orders          Ordered    ciprofloxacin (CIPRO) 500 MG tablet  Every 12 hours        12/14/20 1727             Shanon Ace, PA-C 12/14/20 1748     Milagros Loll, MD 12/16/20 [phone]

## 2021-05-21 ENCOUNTER — Other Ambulatory Visit (HOSPITAL_BASED_OUTPATIENT_CLINIC_OR_DEPARTMENT_OTHER): Payer: Self-pay

## 2021-05-21 ENCOUNTER — Encounter (HOSPITAL_BASED_OUTPATIENT_CLINIC_OR_DEPARTMENT_OTHER): Payer: Self-pay

## 2021-05-21 ENCOUNTER — Other Ambulatory Visit: Payer: Self-pay

## 2021-05-21 ENCOUNTER — Emergency Department (HOSPITAL_BASED_OUTPATIENT_CLINIC_OR_DEPARTMENT_OTHER)
Admission: EM | Admit: 2021-05-21 | Discharge: 2021-05-21 | Disposition: A | Discharge status: Home / Self Care | Payer: Medicaid Other | Attending: Emergency Medicine | Admitting: Emergency Medicine

## 2021-05-21 DIAGNOSIS — G629 Polyneuropathy, unspecified: Secondary | ICD-10-CM

## 2021-05-21 DIAGNOSIS — G5692 Unspecified mononeuropathy of left upper limb: Secondary | ICD-10-CM | POA: Diagnosis not present

## 2021-05-21 DIAGNOSIS — M62838 Other muscle spasm: Secondary | ICD-10-CM | POA: Insufficient documentation

## 2021-05-21 DIAGNOSIS — R200 Anesthesia of skin: Secondary | ICD-10-CM | POA: Diagnosis present

## 2021-05-21 LAB — MAGNESIUM: Magnesium: 2 mg/dL (ref 1.7–2.4)

## 2021-05-21 LAB — CBC WITH DIFFERENTIAL/PLATELET
Abs Immature Granulocytes: 0.04 10*3/uL (ref 0.00–0.07)
Basophils Absolute: 0.1 10*3/uL (ref 0.0–0.1)
Basophils Relative: 1 %
Eosinophils Absolute: 0.1 10*3/uL (ref 0.0–0.5)
Eosinophils Relative: 2 %
HCT: 47.5 % (ref 39.0–52.0)
Hemoglobin: 16.7 g/dL (ref 13.0–17.0)
Immature Granulocytes: 1 %
Lymphocytes Relative: 22 %
Lymphs Abs: 1.3 10*3/uL (ref 0.7–4.0)
MCH: 31.7 pg (ref 26.0–34.0)
MCHC: 35.2 g/dL (ref 30.0–36.0)
MCV: 90.1 fL (ref 80.0–100.0)
Monocytes Absolute: 0.6 10*3/uL (ref 0.1–1.0)
Monocytes Relative: 10 %
Neutro Abs: 3.8 10*3/uL (ref 1.7–7.7)
Neutrophils Relative %: 64 %
Platelets: 287 10*3/uL (ref 150–400)
RBC: 5.27 MIL/uL (ref 4.22–5.81)
RDW: 13.3 % (ref 11.5–15.5)
WBC: 5.8 10*3/uL (ref 4.0–10.5)
nRBC: 0 % (ref 0.0–0.2)

## 2021-05-21 LAB — BASIC METABOLIC PANEL
Anion gap: 9 (ref 5–15)
BUN: 12 mg/dL (ref 6–20)
CO2: 28 mmol/L (ref 22–32)
Calcium: 8.7 mg/dL — ABNORMAL LOW (ref 8.9–10.3)
Chloride: 103 mmol/L (ref 98–111)
Creatinine, Ser: 1.33 mg/dL — ABNORMAL HIGH (ref 0.61–1.24)
GFR, Estimated: >60 mL/min (ref >60)
Glucose, Bld: 84 mg/dL (ref 70–99)
Potassium: 3.9 mmol/L (ref 3.5–5.1)
Sodium: 140 mmol/L (ref 135–145)

## 2021-05-21 LAB — TROPONIN I (HIGH SENSITIVITY): Troponin I (High Sensitivity): 2 ng/L (ref <18)

## 2021-05-21 MED ORDER — METHOCARBAMOL 500 MG PO TABS
500.0000 mg | ORAL_TABLET | Freq: Two times a day (BID) | ORAL | 0 refills | Status: DC
  Filled 2021-05-21: qty 12, 6d supply, fill #0

## 2021-05-21 MED ORDER — IBUPROFEN 600 MG PO TABS
600.0000 mg | ORAL_TABLET | Freq: Four times a day (QID) | ORAL | 0 refills | Status: AC
  Filled 2021-05-21: qty 12, 3d supply, fill #0

## 2021-05-21 MED ORDER — GABAPENTIN 100 MG PO CAPS
200.0000 mg | ORAL_CAPSULE | Freq: Once | ORAL | Status: AC
  Administered 2021-05-21: 200 mg via ORAL
  Filled 2021-05-21: qty 2

## 2021-05-21 MED ORDER — LIDOCAINE 5 % EX PTCH
1.0000 | MEDICATED_PATCH | CUTANEOUS | Status: DC
  Administered 2021-05-21: 1 via TRANSDERMAL
  Filled 2021-05-21: qty 1

## 2021-05-21 NOTE — Discharge Instructions (Addendum)
Take Motrin 600 mg every 6 hours for three days for anti-inflammatory effects  Take Robaxin (muscle relaxer) for muscle tension relief  Ice no heat

## 2021-05-21 NOTE — ED Notes (Signed)
Specimen collection device provided to patient.  

## 2021-05-21 NOTE — ED Provider Notes (Signed)
[facility]ENC[facility]PARTMENT Provider Note   CSN: [id number] Arrival date & time: 05/21/21  1314     History  Chief Complaint  Patient presents with   Numbness    Jacob Church is a 46 y.o. male.  Pt is a 46 yo male presenting for numbness/tingling in left upper extremity. States he has left sided neck pain with radiation to left arm. Denies any slurred speech, facial assymtry, or motor deficits. 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    The history is provided by the patient. No language interpreter was used.      Home Medications Prior to Admission medications   Medication Sig Start Date End Date Taking? Authorizing Provider  methylPREDNISolone (MEDROL DOSEPAK) 4 MG TBPK tablet TAD 07/02/19   Raylene Everts, MD  Multiple Vitamin (MULTIVITAMIN WITH MINERALS) TABS Take 1 tablet by mouth daily.    [provider]  naproxen (NAPROSYN) 375 MG tablet Take 1 tablet (375 mg total) by mouth 2 (two) times daily. 05/16/19   Charlesetta Shanks, MD  orphenadrine (NORFLEX) 100 MG tablet Take 1 tablet (100 mg total) by mouth 2 (two) times daily. 05/16/19   Charlesetta Shanks, MD  pantoprazole (PROTONIX) 40 MG tablet TAKE 1 TABLET BY MOUTH ONCE DAILY FOR 10 DAYS THEN AS NEEDED 06/19/19   [provider]  sildenafil (REVATIO) 20 MG tablet TAKE 1 5 TABLETS BY MOUTH 30 MINUTES BEFORE NEEDED. MAX 5 TABLETS IN 24 HOURS. 08/09/19   [provider]  albuterol (PROVENTIL HFA;VENTOLIN HFA) 108 (90 Base) MCG/ACT inhaler Inhale 1-2 puffs into the lungs every 6 (six) hours as needed for wheezing or shortness of breath. Patient not taking: Reported on 12/14/2017 05/01/17 07/02/19  Frederica Kuster, PA-C      Allergies    Morphine and related and Shellfish allergy    Review of Systems   Review of Systems  Constitutional:  Negative for chills and fever.  HENT:  Negative for ear pain and sore  throat.   Eyes:  Negative for pain and visual disturbance.  Respiratory:  Negative for cough and shortness of breath.   Cardiovascular:  Negative for chest pain and palpitations.  Gastrointestinal:  Negative for abdominal pain and vomiting.  Genitourinary:  Negative for dysuria and hematuria.  Musculoskeletal:  Negative for arthralgias and back pain.  Skin:  Negative for color change and rash.  Neurological:  Negative for seizures and syncope.  All other systems reviewed and are negative.  Physical Exam Updated Vital Signs BP 131/81 (BP Location: Right Arm)    Pulse 80    Temp 98.2 F (36.8 C) (Oral)    Resp 20    Ht 5\' 8"  (1.727 m)    Wt 82.8 kg    SpO2 97%    BMI 27.76 kg/m  Physical Exam Vitals and nursing note reviewed.  Constitutional:      General: He is not in acute distress.    Appearance: He is well-developed.  HENT:     Head: Normocephalic and atraumatic.  Eyes:     Conjunctiva/sclera: Conjunctivae normal.  Cardiovascular:     Rate and Rhythm: Normal rate and regular rhythm.     Pulses:          Radial pulses are 2+ on the right side and 2+ on the left side.     Heart sounds: No murmur heard. Pulmonary:     Effort:  Pulmonary effort is normal. No respiratory distress.     Breath sounds: Normal breath sounds.  Abdominal:     Palpations: Abdomen is soft.     Tenderness: There is no abdominal tenderness.  Musculoskeletal:        General: No swelling.     Cervical back: Neck supple. Tenderness present. No bony tenderness.     Thoracic back: No bony tenderness.     Lumbar back: No bony tenderness.     Comments: Trapezius muscle spasm left side  Skin:    General: Skin is warm and dry.     Capillary Refill: Capillary refill takes less than 2 seconds.  Neurological:     Mental Status: He is alert and oriented to person, place, and time.     GCS: GCS eye subscore is 4. GCS verbal subscore is 5. GCS motor subscore is 6.     Cranial Nerves: Cranial nerves 2-12 are  intact.     Sensory: Sensation is intact.     Motor: Motor function is intact.     Coordination: Coordination is intact.     Gait: Gait is intact.  Psychiatric:        Mood and Affect: Mood normal.    ED Results / Procedures / Treatments   Labs (all labs ordered are listed, but only abnormal results are displayed) Labs Reviewed - No data to display  EKG None  Radiology No results found.  Procedures Procedures    Medications Ordered in ED Medications - No data to display  ED Course/ Medical Decision Making/ A&P                           Medical Decision Making Amount and/or Complexity of Data Reviewed Labs: ordered. ECG/medicine tests: ordered.  Risk Prescription drug management.   2:10 PM  46 yo male presenting for numbness/tingling in left upper extremity. Pt is Aox3, no acute distress, afebrile, with stable vitals. Physical exam demonstrates no neurovascular deficits.   Pain is sharp and nerve like. Pt has muscle spasm of trapezius muscle. 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   Patient in no distress and overall condition improved here in the ED. Detailed discussions were had with the patient regarding current findings, and need for close f/u with PCP or on call doctor. The patient has been instructed to return immediately if the symptoms worsen in any way for re-evaluation. Patient verbalized understanding and is in agreement with current care plan. All questions answered prior to discharge.         Final Clinical Impression(s) / ED Diagnoses Final diagnoses:  Neuropathy  Muscle spasm    Rx / DC Orders ED Discharge Orders     None         Lianne Cure, DO [id number]

## 2021-05-21 NOTE — ED Notes (Signed)
D/c paperwork reviewed with pt, including prescriptions. Pt with no questions or concerns at time of d/c. Pt ambulatory to ED exit without assistance.  

## 2021-05-21 NOTE — ED Triage Notes (Signed)
Pt c/o numbness/tingling to left thumb and posterior neck started yesterday- "got more intense" today with sx to left side of face and entire left UE-HA left temporal started ~30 min PTA

## 2022-01-24 IMAGING — CT CT HEAD W/O CM
3 series · 15 of 47 positions shown, 18 images · non-contrast
Comparison: Head CT dated 11/04/2010.

CLINICAL DATA: 44-year-old male with headache and memory loss.

EXAM:
CT HEAD WITHOUT CONTRAST
TECHNIQUE: Contiguous axial images were obtained from the base of the skull
through the vertex without intravenous contrast.

[Series 2: head wo · axial · 0.47mm/px · z∈[-65,+65]mm · 9 of 32 slices shown, 12 images]
[im 3/32  brain]
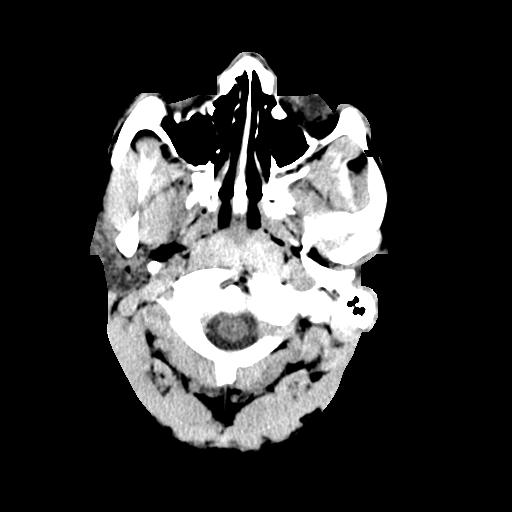
[im 3/32  bone]
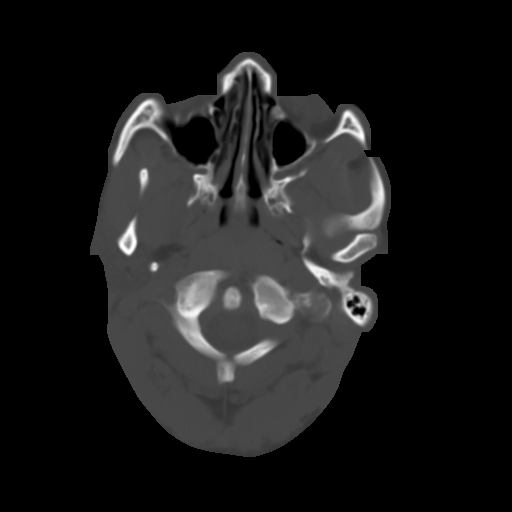
[im 6/32  brain]
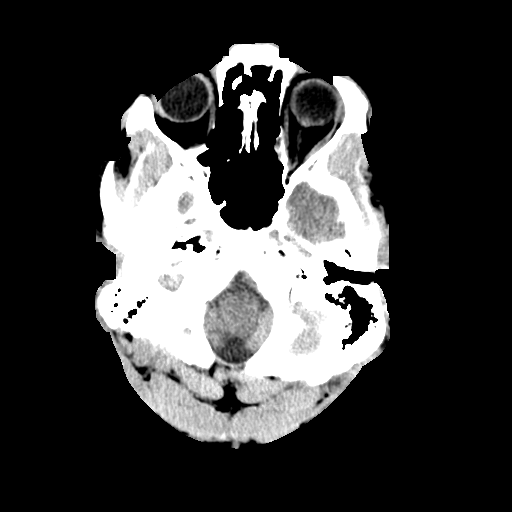
[im 9/32  brain]
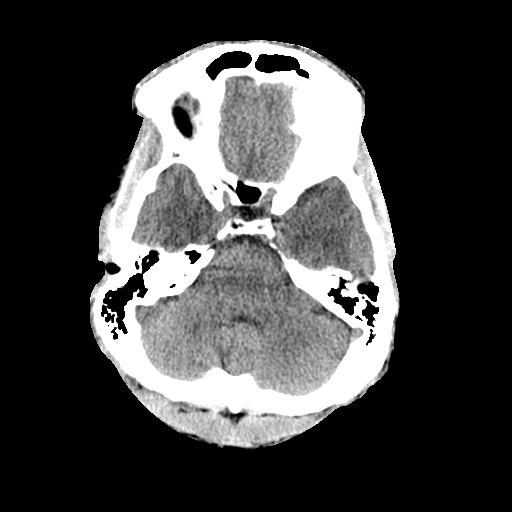
[im 12/32  brain]
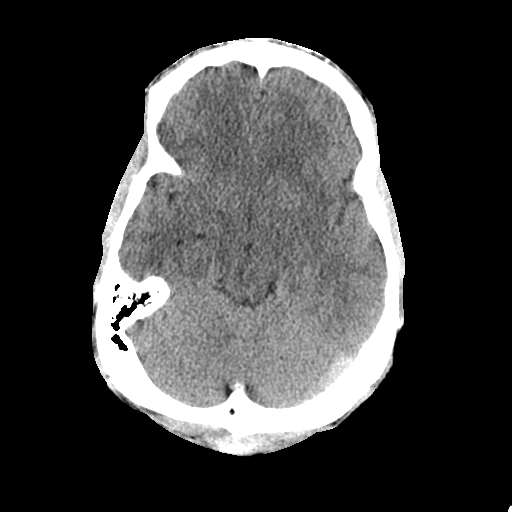
[im 17/32  brain]
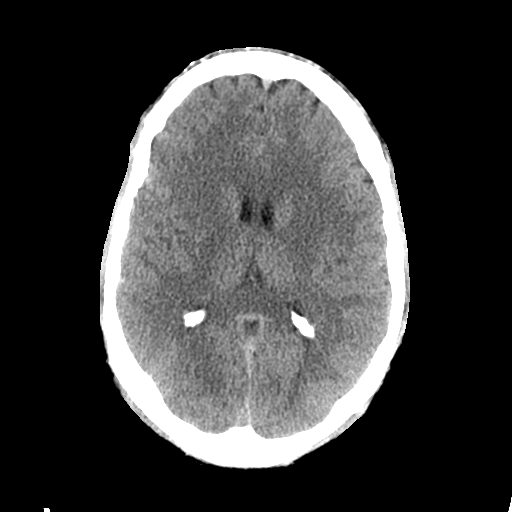
[im 17/32  bone]
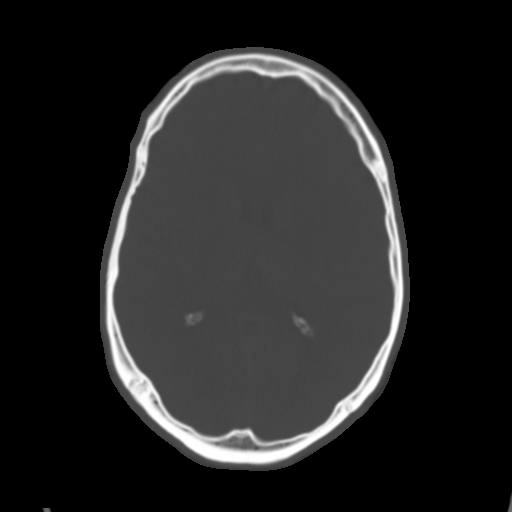
[im 20/32  brain]
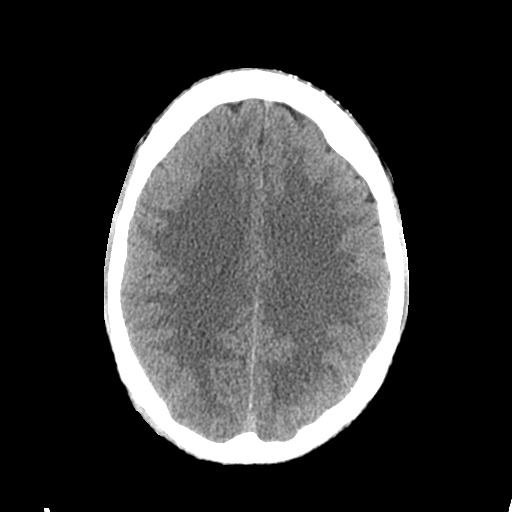
[im 23/32  brain]
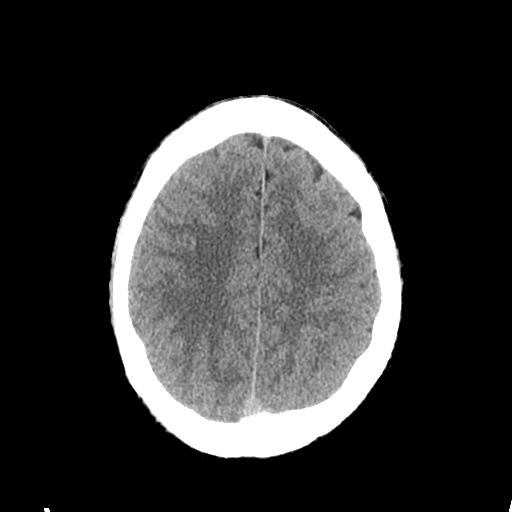
[im 26/32  brain]
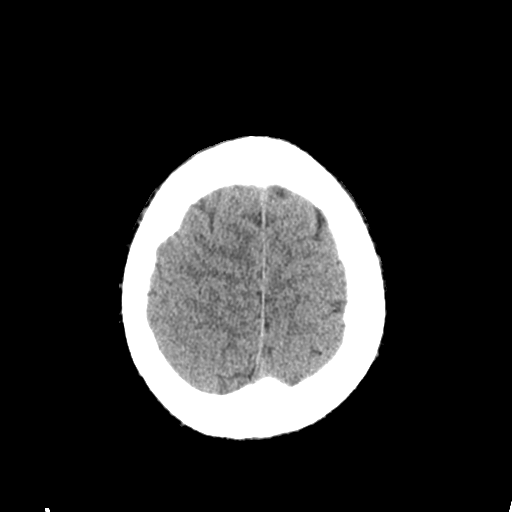
[im 29/32  brain]
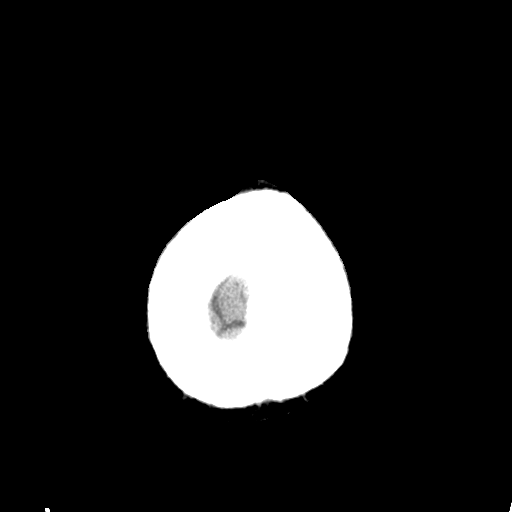
[im 29/32  bone]
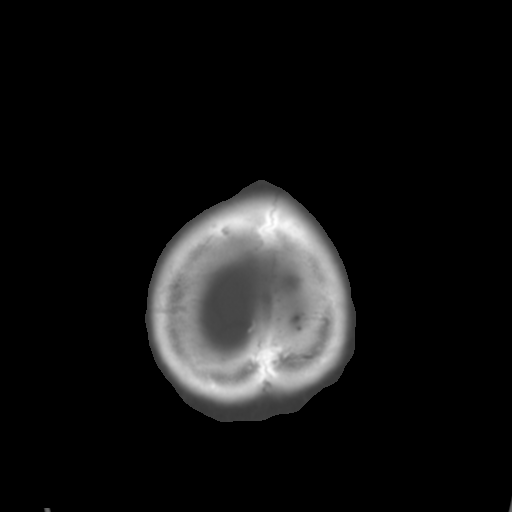

[Series 4: coronal soft tissue · coronal · 0.33mm/px · 3 of 76 slices shown]
[im 26/76  brain]
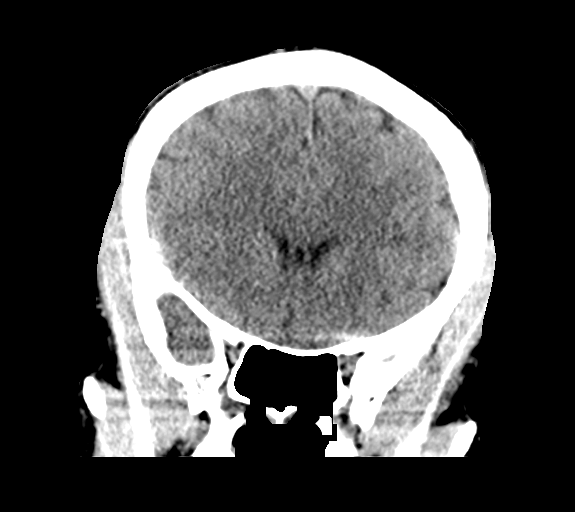
[im 34/76  brain]
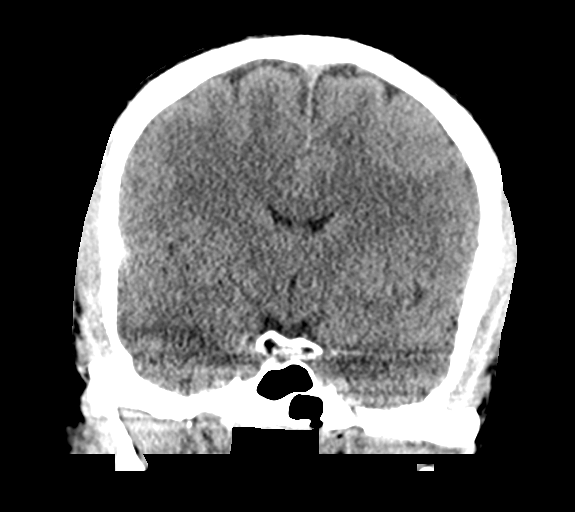
[im 42/76  brain]
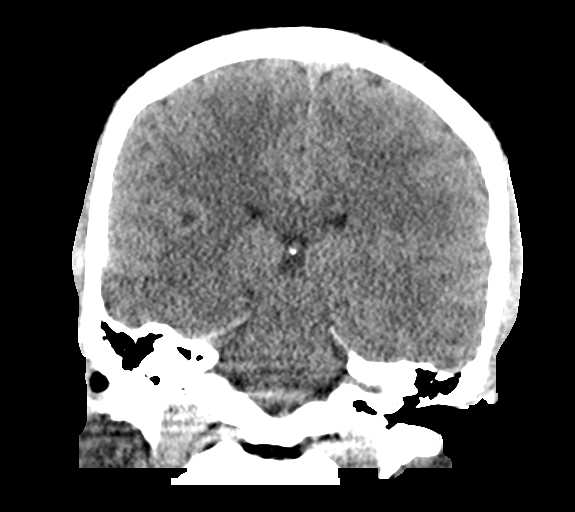

[Series 5: sagittal soft tissue · sagittal · 0.33mm/px · 3 of 61 slices shown]
[im 21/61  brain]
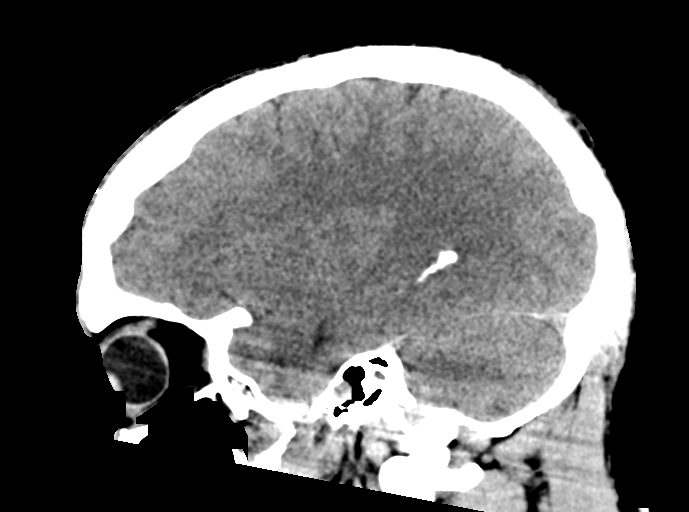
[im 31/61  brain]
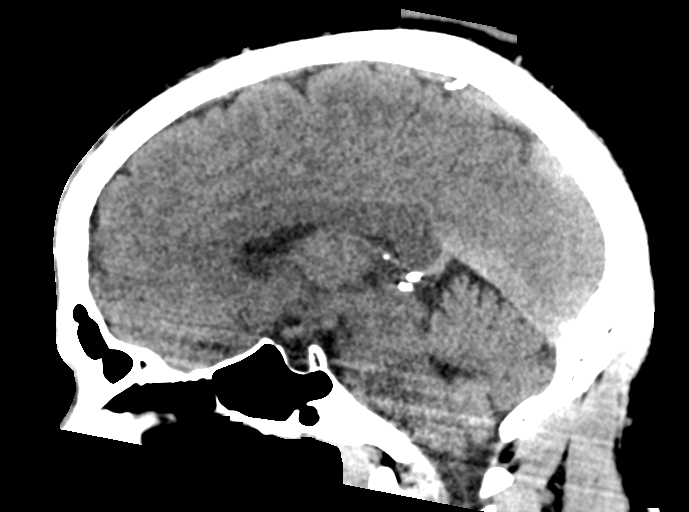
[im 41/61  brain]
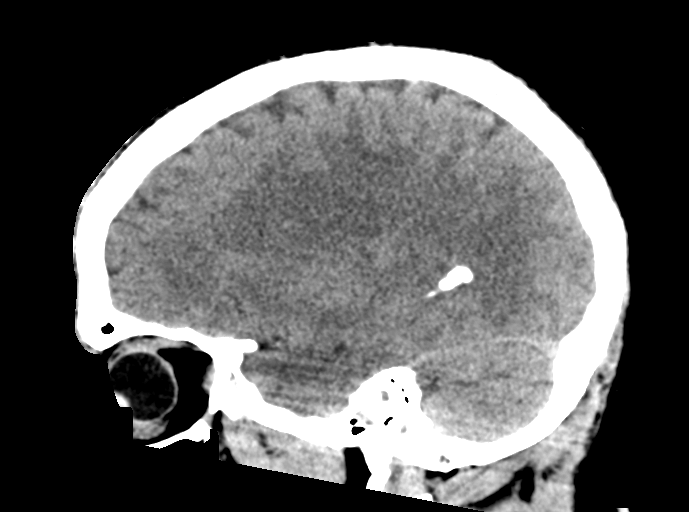

[15 of 47 positions shown; findings below may reference images not displayed]

FINDINGS: Brain: No evidence of acute infarction, hemorrhage, hydrocephalus,
extra-axial collection or mass lesion/mass effect.

Vascular: No hyperdense vessel or unexpected calcification.

Skull: Normal. Negative for fracture or focal lesion.

Sinuses/Orbits: No acute finding.

Other: None.
IMPRESSION: Normal noncontrast CT of the brain.

## 2022-03-19 IMAGING — DX DG FOOT COMPLETE 3+V*L*
3 series · 3 of 3 positions shown · non-contrast
Comparison: None.

CLINICAL DATA: Laceration from glass on heel

EXAM:
LEFT FOOT - COMPLETE 3+ VIEW

[foot ap]
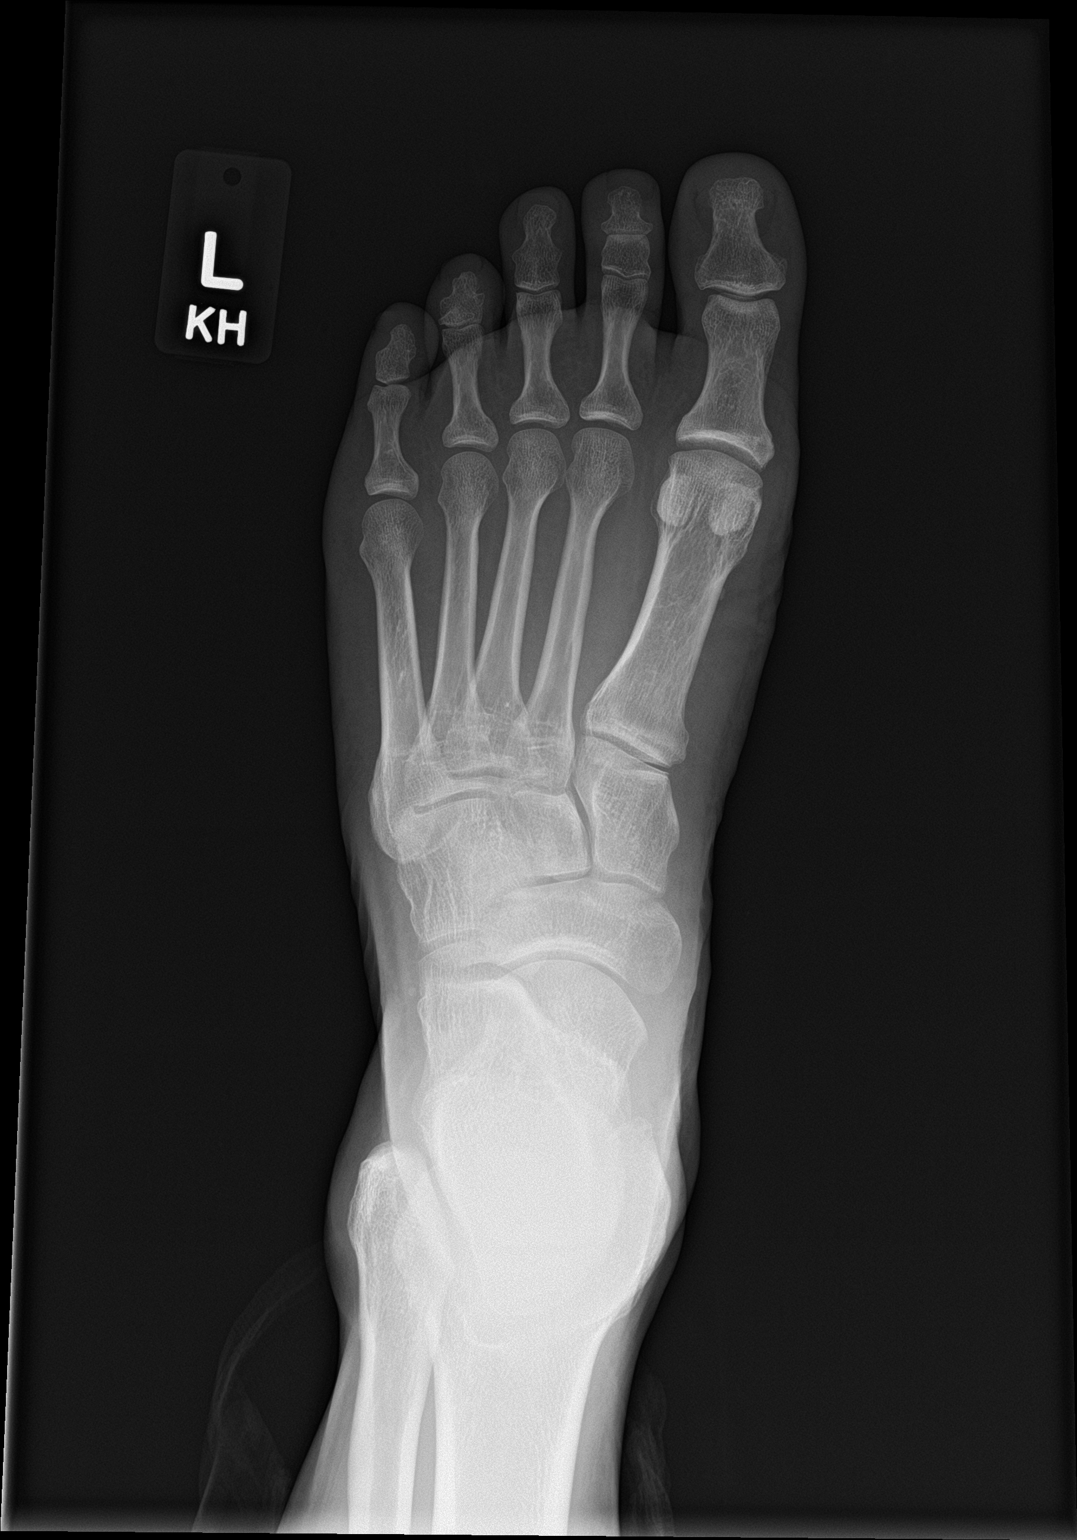

[foot obl]
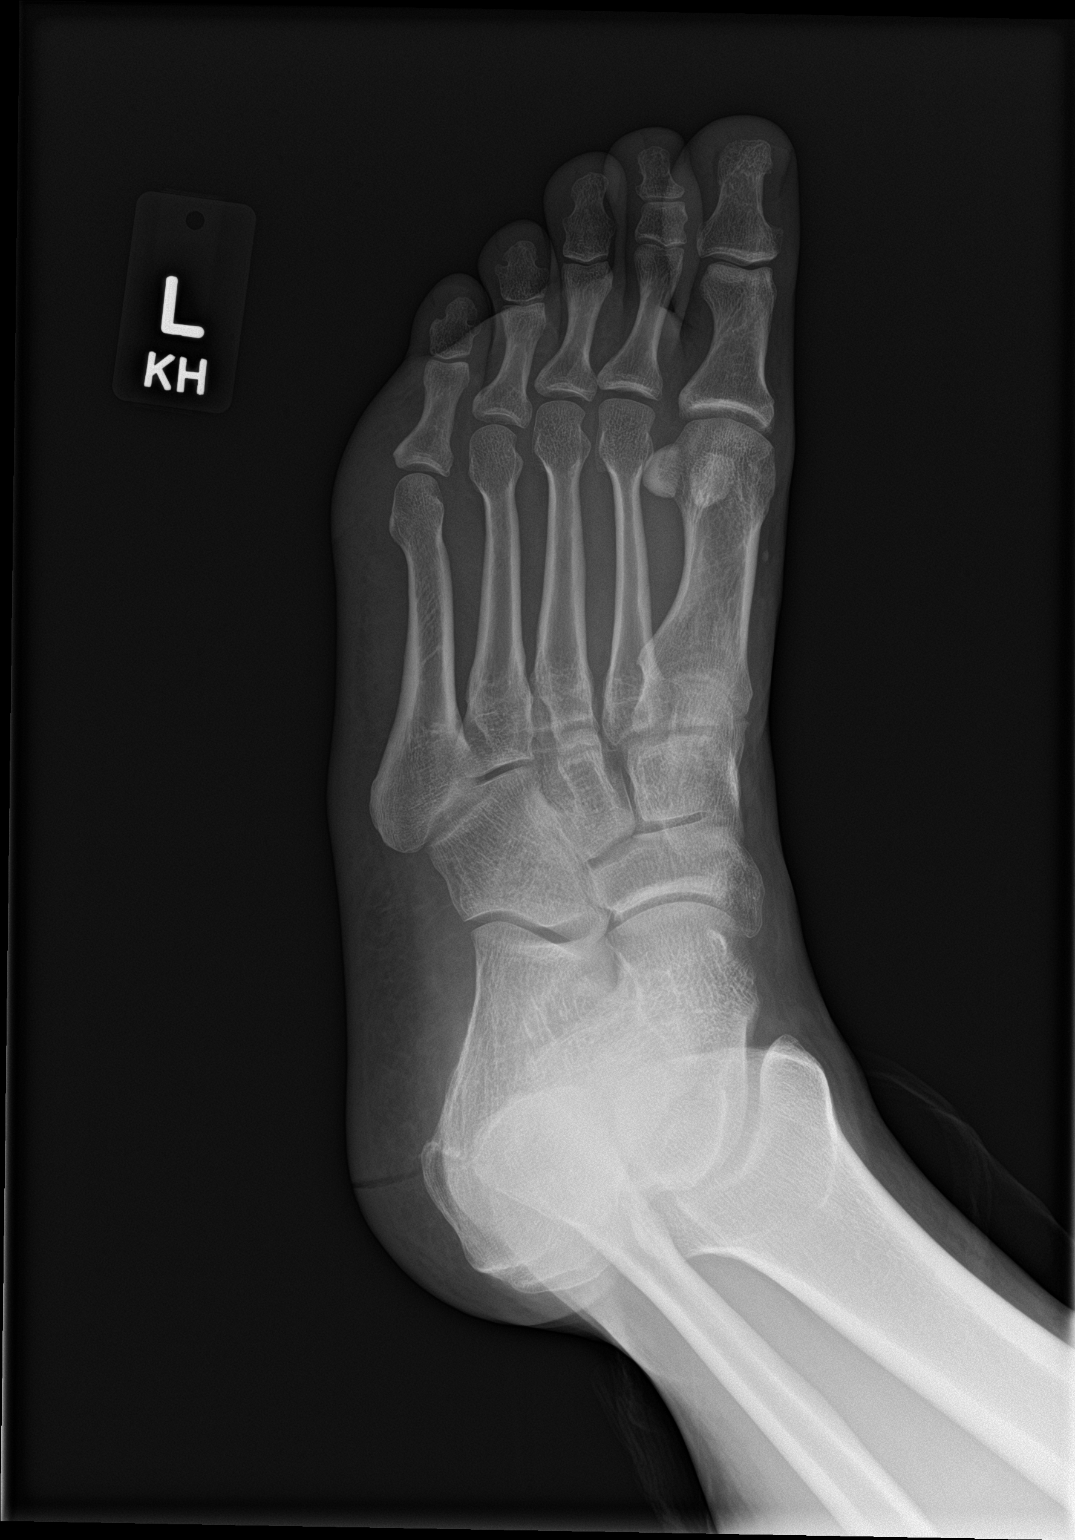

[foot lat]
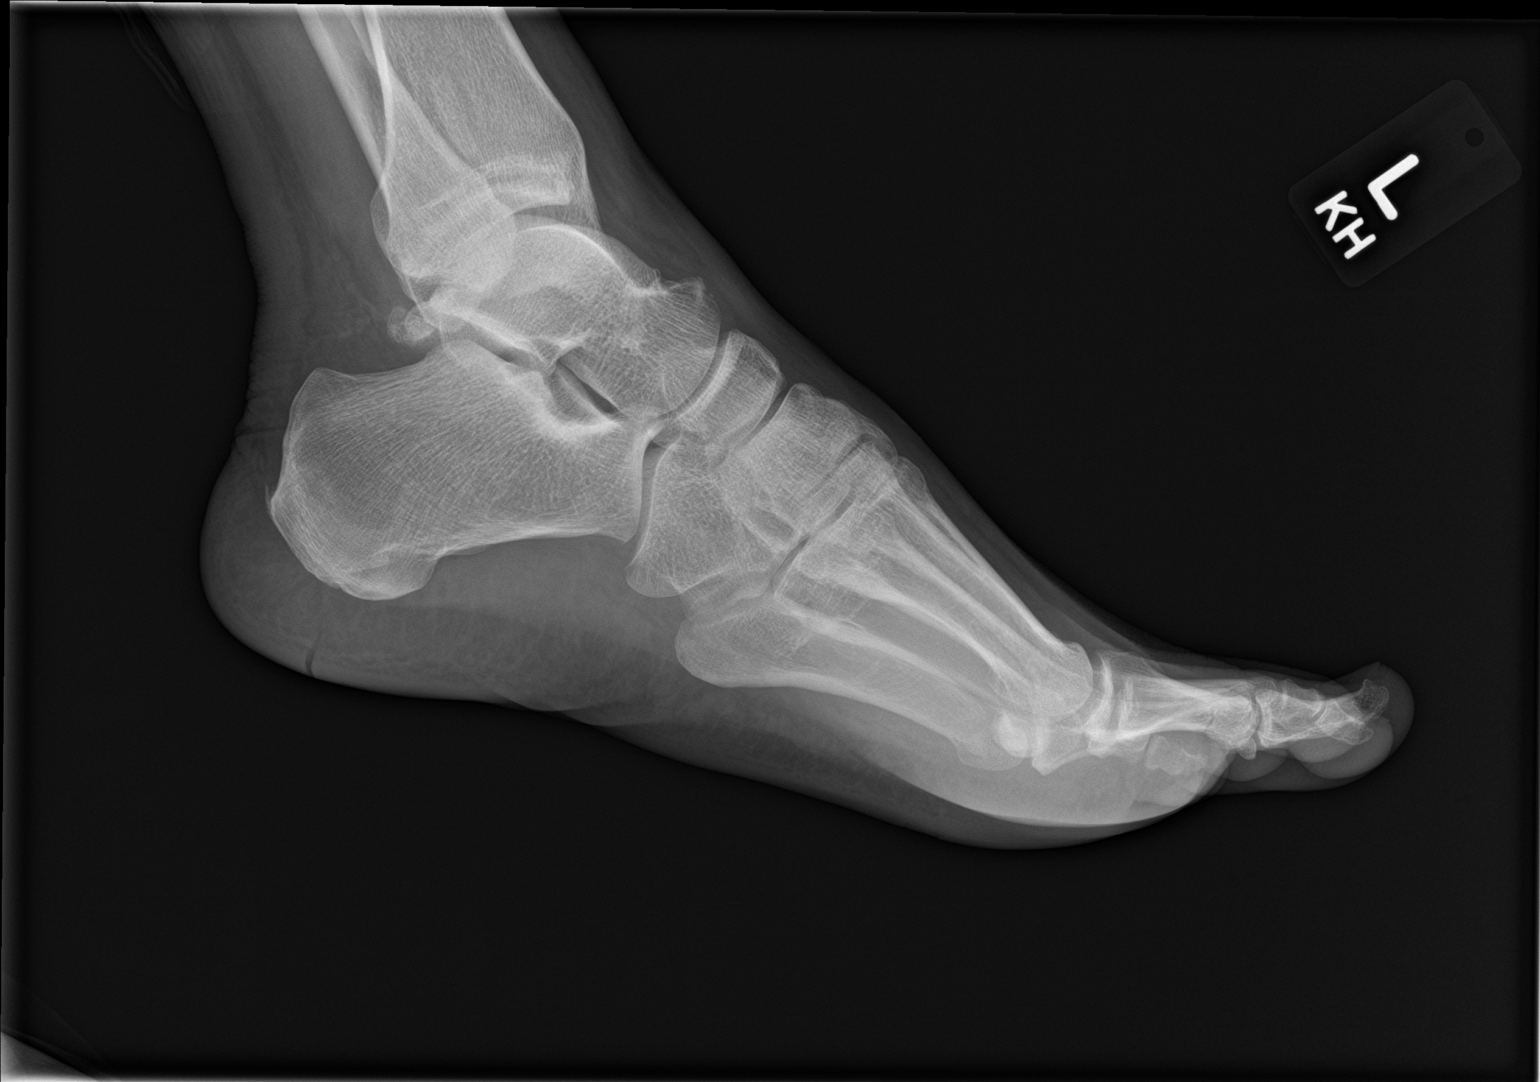

[3 of 3 positions shown; findings below may reference images not displayed]

FINDINGS: There is no evidence of fracture or dislocation. There is no
evidence of arthropathy or other focal bone abnormality. No
radiodense foreign body. Linear lucency in the heel soft tissues
consistent with laceration.
IMPRESSION: 1. Linear laceration of the heel, without radiodense foreign body.

## 2022-06-05 ENCOUNTER — Other Ambulatory Visit (HOSPITAL_COMMUNITY): Payer: Self-pay

## 2022-09-03 ENCOUNTER — Encounter (HOSPITAL_BASED_OUTPATIENT_CLINIC_OR_DEPARTMENT_OTHER): Payer: Self-pay | Admitting: Emergency Medicine

## 2022-09-03 ENCOUNTER — Emergency Department (HOSPITAL_BASED_OUTPATIENT_CLINIC_OR_DEPARTMENT_OTHER)
Admission: EM | Admit: 2022-09-03 | Discharge: 2022-09-03 | Disposition: A | Discharge status: Home / Self Care | Payer: Medicaid Other | Attending: Emergency Medicine | Admitting: Emergency Medicine

## 2022-09-03 ENCOUNTER — Other Ambulatory Visit: Payer: Self-pay

## 2022-09-03 DIAGNOSIS — R222 Localized swelling, mass and lump, trunk: Secondary | ICD-10-CM | POA: Diagnosis present

## 2022-09-03 NOTE — ED Notes (Signed)
Discharge paperwork given and verbally understood. 

## 2022-09-03 NOTE — ED Triage Notes (Signed)
Pt caox4, ambulatory c/o palpable lump in right side of chest that he noticed approx 1 month ago, pt further states it started getting sore 3 days ago and that it "feels bigger than before now."

## 2022-09-03 NOTE — ED Provider Notes (Signed)
[place] EMERGENCY DEPARTMENT AT [facility] Provider Note   CSN: [id number] Arrival date & time: 09/03/22  4098     History  No chief complaint on file.   Jacob Church is a 47 y.o. male who presents emergency department with concerns for lump to the right side of his chest onset 1 month ago.  Notes that the area became more sore 3 days ago.  No meds tried PTA. He does have a PCP at this time. He hasn't informed his PCP of his lump as of yet. Denies fever, pain, nipple discharge, chest pain, shortness of breath, color change.  He notes a family history of breast cancer in his aunt.   The history is provided by the patient. No language interpreter was used.       Home Medications Prior to Admission medications   Medication Sig Start Date End Date Taking? Authorizing Provider  methocarbamol (ROBAXIN) 500 MG tablet Take 1 tablet (500 mg total) by mouth 2 (two) times daily. 05/21/21   Franne Forts, DO  methylPREDNISolone (MEDROL DOSEPAK) 4 MG TBPK tablet TAD 07/02/19   Eustace Moore, MD  Multiple Vitamin (MULTIVITAMIN WITH MINERALS) TABS Take 1 tablet by mouth daily.    [provider]  naproxen (NAPROSYN) 375 MG tablet Take 1 tablet (375 mg total) by mouth 2 (two) times daily. 05/16/19   Arby Barrette, MD  orphenadrine (NORFLEX) 100 MG tablet Take 1 tablet (100 mg total) by mouth 2 (two) times daily. 05/16/19   Arby Barrette, MD  pantoprazole (PROTONIX) 40 MG tablet TAKE 1 TABLET BY MOUTH ONCE DAILY FOR 10 DAYS THEN AS NEEDED 06/19/19   [provider]  sildenafil (REVATIO) 20 MG tablet TAKE 1 5 TABLETS BY MOUTH 30 MINUTES BEFORE NEEDED. MAX 5 TABLETS IN 24 HOURS. 08/09/19   [provider]  albuterol (PROVENTIL HFA;VENTOLIN HFA) 108 (90 Base) MCG/ACT inhaler Inhale 1-2 puffs into the lungs every 6 (six) hours as needed for wheezing or shortness of breath. Patient not taking: Reported on 12/14/2017 05/01/17 07/02/19  Emi Holes, PA-C       Allergies    Morphine and related and Shellfish allergy    Review of Systems   Review of Systems  All other systems reviewed and are negative.   Physical Exam Updated Vital Signs BP 124/81 (BP Location: Left Arm)   Pulse 95   Temp 98.3 F (36.8 C) (Oral)   Resp 16   Ht 5\' 8"  (1.727 m)   Wt 82.1 kg   SpO2 96%   BMI 27.52 kg/m  Physical Exam Vitals and nursing note reviewed.  Constitutional:      General: He is not in acute distress.    Appearance: Normal appearance.  Eyes:     General: No scleral icterus.    Extraocular Movements: Extraocular movements intact.  Cardiovascular:     Rate and Rhythm: Normal rate.  Pulmonary:     Effort: Pulmonary effort is normal. No respiratory distress.  Chest:     Comments: Mobile mass noted posterior to right nipple. No TTP noted to area. No nipple drainage noted. No erythema. No palpable area of induration or fluctuance noted.  Abdominal:     Palpations: Abdomen is soft. There is no mass.     Tenderness: There is no abdominal tenderness.  Musculoskeletal:        General: Normal range of motion.     Cervical back: Neck supple.  Skin:    General: Skin  is warm and dry.     Findings: No rash.  Neurological:     Mental Status: He is alert.     Sensory: Sensation is intact.     Motor: Motor function is intact.  Psychiatric:        Behavior: Behavior normal.     ED Results / Procedures / Treatments   Labs (all labs ordered are listed, but only abnormal results are displayed) Labs Reviewed - No data to display  EKG None  Radiology No results found.  Procedures Procedures    Medications Ordered in ED Medications - No data to display  ED Course/ Medical Decision Making/ A&P                             Medical Decision Making  Pt presents with lump to the chest wall onset 1 month.  Has not been evaluated by his primary care provider.  Denies fever, nipple discharge/pain.  History of breast cancer in his aunt.   Patient afebrile.  On exam patient with mobile mass noted posterior to right nipple. No TTP noted to area. No nipple drainage noted. No erythema. No palpable area of induration or fluctuance noted. Differential diagnosis includes abscess, cellulitis, lipoma.    Disposition: Presenting suspicious with lump to chest wall.  Doubt concerns at this time for abscess or cellulitis.  No appreciable fluctuance or induration noted on exam.  No appreciable erythema or overlying skin changes noted today.  No nipple discharge noted today.  Lipomas on the differential however breast ultrasound will need to be obtained to further evaluate.  Patient provided with information for the breast center for an ultrasound.  Patient also instructed to follow-up with his primary care provider. After consideration of the diagnostic results and the patients response to treatment, I feel that the patient would benefit from Discharge home. Supportive care measures and strict return precautions discussed with patient at bedside. Pt acknowledges and verbalizes understanding. Pt appears safe for discharge. Follow up as indicated in discharge paperwork.    This chart was dictated using voice recognition software, Dragon. Despite the best efforts of this provider to proofread and correct errors, errors may still occur which can change documentation meaning.   Final Clinical Impression(s) / ED Diagnoses Final diagnoses:  Lump in chest    Rx / DC Orders ED Discharge Orders     None         Aalina Brege A, PA-C 09/03/22 0948    Virgina Norfolk, DO 09/03/22 1008

## 2022-09-03 NOTE — Discharge Instructions (Addendum)
It was a pleasure taking care of you today!   Call your primary care provider to set up a follow-up appointment regarding today's ED visit for further evaluation of your symptoms.  Attached is information for the breast center, you may call and set up a follow-up appointment for an ultrasound of your chest.  Return to the emergency department if you are experiencing increasing/worsening fever, nipple drainage, color change, worsening symptoms.

## 2023-07-21 ENCOUNTER — Other Ambulatory Visit (HOSPITAL_COMMUNITY): Payer: Self-pay

## 2023-12-19 ENCOUNTER — Encounter (HOSPITAL_BASED_OUTPATIENT_CLINIC_OR_DEPARTMENT_OTHER): Payer: Self-pay | Admitting: Emergency Medicine

## 2023-12-19 ENCOUNTER — Emergency Department (HOSPITAL_BASED_OUTPATIENT_CLINIC_OR_DEPARTMENT_OTHER)

## 2023-12-19 ENCOUNTER — Emergency Department (HOSPITAL_BASED_OUTPATIENT_CLINIC_OR_DEPARTMENT_OTHER): Admission: EM | Admit: 2023-12-19 | Discharge: 2023-12-19 | Disposition: A | Discharge status: Home / Self Care

## 2023-12-19 ENCOUNTER — Other Ambulatory Visit: Payer: Self-pay

## 2023-12-19 DIAGNOSIS — G8929 Other chronic pain: Secondary | ICD-10-CM | POA: Diagnosis not present

## 2023-12-19 DIAGNOSIS — M25511 Pain in right shoulder: Secondary | ICD-10-CM | POA: Diagnosis present

## 2023-12-19 MED ORDER — METHOCARBAMOL 500 MG PO TABS: 500.0000 mg | ORAL_TABLET | Freq: Two times a day (BID) | ORAL | 0 refills | Status: AC | PRN

## 2023-12-19 MED ORDER — KETOROLAC TROMETHAMINE 15 MG/ML IJ SOLN
15.0000 mg | Freq: Once | INTRAMUSCULAR | Status: AC
  Administered 2023-12-19: 15 mg via INTRAMUSCULAR
  Filled 2023-12-19: qty 1

## 2023-12-19 NOTE — ED Provider Notes (Signed)
 [place] [facility] AT [facility] Provider Note   CSN: [id number] Arrival date & time: 12/19/23  8191     Patient presents with: Shoulder Pain   Jacob Church is a 48 y.o. male.  48 year old male presents to the ED with complaints of right shoulder pain for several weeks.  Patient reports he has pain with normal everyday activities like cranking the car and lifting weights.  Patient advises the pain has been going on for quite a while and has not gotten worse or better.  The pain is described as a dull ache inside the joint.  Patient denies taking any medications at home besides occasional ibuprofen  without relief.  Patient advises he has been able to still lift weights just not his normal volume.  He advises sometimes at night when he rolls on it moves his shoulder around.  Patient denies any medical history at bedside.     Prior to Admission medications   Medication Sig Start Date End Date Taking? Authorizing Provider  methocarbamol  (ROBAXIN ) 500 MG tablet Take 1 tablet (500 mg total) by mouth 2 (two) times daily as needed for muscle spasms. 12/19/23  Yes Myriam Fonda RAMAN, PA-C  methylPREDNISolone  (MEDROL  DOSEPAK) 4 MG TBPK tablet TAD 07/02/19   Maranda Jamee Jacob, MD  Multiple Vitamin (MULTIVITAMIN WITH MINERALS) TABS Take 1 tablet by mouth daily.    [provider]  naproxen  (NAPROSYN ) 375 MG tablet Take 1 tablet (375 mg total) by mouth 2 (two) times daily. 05/16/19   Armenta Canning, MD  orphenadrine  (NORFLEX ) 100 MG tablet Take 1 tablet (100 mg total) by mouth 2 (two) times daily. 05/16/19   Armenta Canning, MD  pantoprazole (PROTONIX) 40 MG tablet TAKE 1 TABLET BY MOUTH ONCE DAILY FOR 10 DAYS THEN AS NEEDED 06/19/19   [provider]  sildenafil (REVATIO) 20 MG tablet TAKE 1 5 TABLETS BY MOUTH 30 MINUTES BEFORE NEEDED. MAX 5 TABLETS IN 24 HOURS. 08/09/19   [provider]  albuterol  (PROVENTIL  HFA;VENTOLIN  HFA) 108 (90 Base) MCG/ACT  inhaler Inhale 1-2 puffs into the lungs every 6 (six) hours as needed for wheezing or shortness of breath. Patient not taking: Reported on 12/14/2017 05/01/17 07/02/19  Rendell Lorane HERO, PA-C    Allergies: Morphine  and codeine  and Shellfish allergy    Review of Systems  Musculoskeletal:  Positive for arthralgias and myalgias.  All other systems reviewed and are negative.   Updated Vital Signs BP 115/80 (BP Location: Left Arm)   Pulse 88   Temp 98 F (36.7 C)   Resp 18   Ht 5' 8 (1.727 m)   Wt 82.1 kg   SpO2 97%   BMI 27.52 kg/m   Physical Exam Vitals and nursing note reviewed.  Constitutional:      General: He is not in acute distress.    Appearance: Normal appearance. He is not ill-appearing.  HENT:     Head: Normocephalic and atraumatic.  Eyes:     Extraocular Movements: Extraocular movements intact.     Pupils: Pupils are equal, round, and reactive to light.  Cardiovascular:     Rate and Rhythm: Normal rate.  Pulmonary:     Effort: Pulmonary effort is normal. No respiratory distress.  Abdominal:     Tenderness: There is no guarding.  Musculoskeletal:     Right shoulder: Tenderness present. No swelling, effusion, laceration, bony tenderness or crepitus. Decreased range of motion.     Left shoulder: Normal.     Cervical  back: Normal range of motion.  Skin:    General: Skin is warm and dry.  Neurological:     General: No focal deficit present.     Mental Status: He is alert.  Psychiatric:        Mood and Affect: Mood normal.        Behavior: Behavior normal.     (all labs ordered are listed, but only abnormal results are displayed) Labs Reviewed - No data to display  EKG: None  Radiology: DG Shoulder Right Result Date: 12/19/2023 CLINICAL DATA:  Ongoing right shoulder pain. Pain with motion. No recent injury. EXAM: RIGHT SHOULDER - 2+ VIEW COMPARISON:  02/12/2014 FINDINGS: Moderately prominent degenerative changes in the right shoulder with large osteophyte  arising from the humeral head medially. Degenerative changes are progressed since prior study. No evidence of acute fracture or dislocation. No focal bone lesion or bone destruction. Bone cortex appears intact. Soft tissues are unremarkable. IMPRESSION: Prominent hypertrophic degenerative changes in the right shoulder demonstrating progression since previous study. No acute bony abnormalities. Electronically Signed   By: Elsie Gravely M.D.   On: 12/19/2023 19:20     Procedures   Medications Ordered in the ED  ketorolac  (TORADOL ) 15 MG/ML injection 15 mg (15 mg Intramuscular Given 12/19/23 1942)    48 y.o. male presents to the ED for concern of Shoulder Pain     This involves an extensive number of treatment options, and is a complaint that carries with it a high risk of complications and morbidity.  The differential diagnosis prior to evaluation includes, but is not limited to: Frozen shoulder, rotator cuff injury, shoulder dislocation, fracture, osteo malignancy, arthritis  This is not an exhaustive differential.   Past Medical History / Co-morbidities / Social History: Hx of patient denies medical history Social Determinants of Health include: Occasional alcohol  Additional History:  Obtained by chart review.  Notably degenerative disc disease followed by [facility]   Imaging Studies: I ordered imaging studies including right shoulder x-ray.   I independently visualized and interpreted imaging which showed degenerative changes in the shoulder with osteophytes. I agree with the radiologist interpretation.   ED Course / Critical Interventions: Pt well-appearing on exam sitting comfortably in ED bed.  Patient is able to move his arm without assistance with minimal pain.  Patient has no pain to range of motion in elbow.  Patient has discomfort with Neer's test and empty can test with indication of rotator cuff injury.  Patient reports he has been able to lift weights through the  injury and go about his daily activities at current pain level.  There is no obvious injury, deformities erythema, or heat to the joint.  Patient has not been febrile at home and denies recent illnesses.  Upon reevaluation, x-ray showed degenerative changes in the shoulder.  Patient was advised of findings and it was advised to follow-up with orthopedics for further management of shoulder pain.  Patient agreed with treatment plan and was comfortable discharge.  I have reviewed the patients home medicines and have made adjustments as needed.  Disposition: Considered admission and after reviewing the patient's encounter today, I feel that the patient would benefit from follow-up with Ortho.  Discussed course of treatment with the patient, whom demonstrated understanding.  Patient in agreement and has no further questions.    I discussed this case with my attending, Dr. Gennaro, who agreed with the proposed treatment course and cosigned this note including patient's presenting symptoms, physical exam, and  planned diagnostics and interventions.  Attending physician stated agreement with plan or made changes to plan which were implemented.     This chart was dictated using voice recognition software.  Despite best efforts to proofread, errors can occur which can change the documentation meaning.   Final diagnoses:  Chronic right shoulder pain    ED Discharge Orders          Ordered    methocarbamol  (ROBAXIN ) 500 MG tablet  2 times daily PRN        12/19/23 1932               Andrian Sabala S, PA-C 12/19/23 2019    Kammerer, Megan L, DO 12/20/23 1325

## 2023-12-19 NOTE — Discharge Instructions (Addendum)
 [facility] referral is attached.  Follow-up with them as soon as possible for management of shoulder pain.  Use muscle relaxers as needed twice a day.  Return to ED for worsening symptoms, signs of infection, or if range of motion is significantly decreased.

## 2023-12-19 NOTE — ED Triage Notes (Signed)
 Pt c/o R shoulder pain, ongoing.  Reports difficulty with ROM x 3 days. Denies known injury.

## 2024-04-02 ENCOUNTER — Other Ambulatory Visit: Payer: Self-pay
